# Patient Record
Sex: Female | Born: 1960 | Race: White | Hispanic: No | Marital: Married | Smoking: Never smoker
Health system: Southern US, Community
[De-identification: ages and names within clinical notes are randomized; demographics above are authoritative.]

## PROBLEM LIST (undated history)

## (undated) DIAGNOSIS — F32A Depression, unspecified: Secondary | ICD-10-CM

## (undated) DIAGNOSIS — Z87440 Personal history of urinary (tract) infections: Secondary | ICD-10-CM

## (undated) DIAGNOSIS — F329 Major depressive disorder, single episode, unspecified: Secondary | ICD-10-CM

## (undated) DIAGNOSIS — R519 Headache, unspecified: Secondary | ICD-10-CM

## (undated) DIAGNOSIS — R51 Headache: Secondary | ICD-10-CM

## (undated) DIAGNOSIS — N83209 Unspecified ovarian cyst, unspecified side: Secondary | ICD-10-CM

## (undated) HISTORY — PX: BUNIONECTOMY: SHX129

## (undated) HISTORY — PX: ANTERIOR CRUCIATE LIGAMENT REPAIR: SHX115

## (undated) HISTORY — PX: OTHER SURGICAL HISTORY: SHX169

## (undated) HISTORY — PX: CHOLECYSTECTOMY: SHX55

---

## 1998-06-05 ENCOUNTER — Ambulatory Visit (HOSPITAL_COMMUNITY): Admission: RE | Admit: 1998-06-05 | Discharge: 1998-06-05 | Payer: Self-pay | Admitting: Gynecology

## 1998-06-12 ENCOUNTER — Ambulatory Visit (HOSPITAL_COMMUNITY): Admission: RE | Admit: 1998-06-12 | Discharge: 1998-06-12 | Payer: Self-pay | Admitting: Gynecology

## 1999-03-07 ENCOUNTER — Inpatient Hospital Stay (HOSPITAL_COMMUNITY): Admission: AD | Admit: 1999-03-07 | Discharge: 1999-03-09 | Payer: Self-pay | Admitting: Obstetrics and Gynecology

## 1999-03-10 ENCOUNTER — Encounter: Admission: RE | Admit: 1999-03-10 | Discharge: 1999-06-08 | Payer: Self-pay | Admitting: Obstetrics and Gynecology

## 1999-04-04 ENCOUNTER — Other Ambulatory Visit: Admission: RE | Admit: 1999-04-04 | Discharge: 1999-04-04 | Payer: Self-pay | Admitting: Obstetrics and Gynecology

## 1999-06-10 ENCOUNTER — Encounter: Admission: RE | Admit: 1999-06-10 | Discharge: 1999-09-08 | Payer: Self-pay | Admitting: Obstetrics and Gynecology

## 1999-11-28 ENCOUNTER — Ambulatory Visit (HOSPITAL_BASED_OUTPATIENT_CLINIC_OR_DEPARTMENT_OTHER): Admission: RE | Admit: 1999-11-28 | Discharge: 1999-11-29 | Payer: Self-pay | Admitting: Orthopedic Surgery

## 2000-02-05 ENCOUNTER — Emergency Department (HOSPITAL_COMMUNITY): Admission: EM | Admit: 2000-02-05 | Discharge: 2000-02-05 | Payer: Self-pay | Admitting: Emergency Medicine

## 2000-02-07 ENCOUNTER — Observation Stay (HOSPITAL_COMMUNITY): Admission: EM | Admit: 2000-02-07 | Discharge: 2000-02-08 | Payer: Self-pay | Admitting: Emergency Medicine

## 2000-02-07 ENCOUNTER — Encounter: Payer: Self-pay | Admitting: Family Medicine

## 2000-02-07 ENCOUNTER — Encounter: Admission: RE | Admit: 2000-02-07 | Discharge: 2000-02-07 | Payer: Self-pay | Admitting: Family Medicine

## 2001-12-11 ENCOUNTER — Other Ambulatory Visit: Admission: RE | Admit: 2001-12-11 | Discharge: 2001-12-11 | Payer: Self-pay | Admitting: Obstetrics & Gynecology

## 2003-05-13 ENCOUNTER — Other Ambulatory Visit: Admission: RE | Admit: 2003-05-13 | Discharge: 2003-05-13 | Payer: Self-pay | Admitting: Obstetrics & Gynecology

## 2004-06-02 ENCOUNTER — Other Ambulatory Visit: Admission: RE | Admit: 2004-06-02 | Discharge: 2004-06-02 | Payer: Self-pay | Admitting: Obstetrics & Gynecology

## 2013-05-22 HISTORY — PX: BREAST BIOPSY: SHX20

## 2013-07-09 ENCOUNTER — Other Ambulatory Visit: Payer: Self-pay | Admitting: Obstetrics & Gynecology

## 2013-07-09 DIAGNOSIS — R928 Other abnormal and inconclusive findings on diagnostic imaging of breast: Secondary | ICD-10-CM

## 2013-07-22 ENCOUNTER — Other Ambulatory Visit: Payer: Self-pay | Admitting: Obstetrics & Gynecology

## 2013-07-22 ENCOUNTER — Ambulatory Visit
Admission: RE | Admit: 2013-07-22 | Discharge: 2013-07-22 | Disposition: A | Discharge status: Home / Self Care | Payer: BC Managed Care – PPO | Source: Ambulatory Visit | Attending: Obstetrics & Gynecology | Admitting: Obstetrics & Gynecology

## 2013-07-22 DIAGNOSIS — R928 Other abnormal and inconclusive findings on diagnostic imaging of breast: Secondary | ICD-10-CM

## 2013-08-04 ENCOUNTER — Other Ambulatory Visit: Payer: BC Managed Care – PPO

## 2013-08-05 ENCOUNTER — Ambulatory Visit
Admission: RE | Admit: 2013-08-05 | Discharge: 2013-08-05 | Disposition: A | Discharge status: Home / Self Care | Payer: BC Managed Care – PPO | Source: Ambulatory Visit | Attending: Obstetrics & Gynecology | Admitting: Obstetrics & Gynecology

## 2013-08-05 ENCOUNTER — Other Ambulatory Visit: Payer: Self-pay | Admitting: Obstetrics & Gynecology

## 2013-08-05 DIAGNOSIS — R928 Other abnormal and inconclusive findings on diagnostic imaging of breast: Secondary | ICD-10-CM

## 2014-07-17 ENCOUNTER — Other Ambulatory Visit: Payer: Self-pay

## 2014-07-17 DIAGNOSIS — Z1231 Encounter for screening mammogram for malignant neoplasm of breast: Secondary | ICD-10-CM

## 2014-07-31 ENCOUNTER — Ambulatory Visit
Admission: RE | Admit: 2014-07-31 | Discharge: 2014-07-31 | Disposition: A | Discharge status: Home / Self Care | Payer: BLUE CROSS/BLUE SHIELD | Source: Ambulatory Visit

## 2014-07-31 DIAGNOSIS — Z1231 Encounter for screening mammogram for malignant neoplasm of breast: Secondary | ICD-10-CM

## 2014-08-03 ENCOUNTER — Other Ambulatory Visit: Payer: Self-pay | Admitting: Obstetrics & Gynecology

## 2014-08-03 DIAGNOSIS — R928 Other abnormal and inconclusive findings on diagnostic imaging of breast: Secondary | ICD-10-CM

## 2014-08-04 ENCOUNTER — Other Ambulatory Visit: Payer: Self-pay | Admitting: Obstetrics & Gynecology

## 2014-08-04 DIAGNOSIS — R1900 Intra-abdominal and pelvic swelling, mass and lump, unspecified site: Secondary | ICD-10-CM

## 2014-08-07 ENCOUNTER — Ambulatory Visit
Admission: RE | Admit: 2014-08-07 | Discharge: 2014-08-07 | Disposition: A | Discharge status: Home / Self Care | Payer: BLUE CROSS/BLUE SHIELD | Source: Ambulatory Visit | Attending: Obstetrics & Gynecology | Admitting: Obstetrics & Gynecology

## 2014-08-07 DIAGNOSIS — R1900 Intra-abdominal and pelvic swelling, mass and lump, unspecified site: Secondary | ICD-10-CM

## 2014-08-07 DIAGNOSIS — R928 Other abnormal and inconclusive findings on diagnostic imaging of breast: Secondary | ICD-10-CM

## 2014-08-07 MED ORDER — IOPAMIDOL (ISOVUE-300) INJECTION 61%
100.0000 mL | Freq: Once | INTRAVENOUS | Status: AC | PRN
  Administered 2014-08-07: 100 mL via INTRAVENOUS

## 2014-09-14 ENCOUNTER — Ambulatory Visit: Payer: BLUE CROSS/BLUE SHIELD | Admitting: Gynecologic Oncology

## 2014-09-18 ENCOUNTER — Encounter: Payer: Self-pay | Admitting: Gynecologic Oncology

## 2014-09-18 ENCOUNTER — Ambulatory Visit: Payer: BLUE CROSS/BLUE SHIELD | Attending: Gynecologic Oncology | Admitting: Gynecologic Oncology

## 2014-09-18 VITALS — BP 139/73 | HR 94 | Temp 98.2°F | Resp 18 | Ht 62.0 in | Wt 172.3 lb

## 2014-09-18 DIAGNOSIS — R971 Elevated cancer antigen 125 [CA 125]: Secondary | ICD-10-CM | POA: Diagnosis not present

## 2014-09-18 DIAGNOSIS — N8320 Unspecified ovarian cysts: Secondary | ICD-10-CM | POA: Insufficient documentation

## 2014-09-18 DIAGNOSIS — N83201 Unspecified ovarian cyst, right side: Secondary | ICD-10-CM

## 2014-09-18 DIAGNOSIS — E669 Obesity, unspecified: Secondary | ICD-10-CM | POA: Insufficient documentation

## 2014-09-18 DIAGNOSIS — Z803 Family history of malignant neoplasm of breast: Secondary | ICD-10-CM

## 2014-09-18 DIAGNOSIS — Z800 Family history of malignant neoplasm of digestive organs: Secondary | ICD-10-CM | POA: Diagnosis not present

## 2014-09-18 DIAGNOSIS — N83202 Unspecified ovarian cyst, left side: Secondary | ICD-10-CM

## 2014-09-18 NOTE — Patient Instructions (Signed)
Preparing for your Surgery  Plan for surgery on May 3 with Dr. Denman George to have a robotic assisted bilateral salpingo-oophorectomy (removal of both tubes and ovaries).  Pre-operative Testing -You will receive a phone call from presurgical testing at [facility] to arrange for a pre-operative testing appointment before your surgery.  This appointment normally occurs one to two weeks before your scheduled surgery.   -Bring your insurance card, copy of an advanced directive if applicable, medication list  -At that visit, you will be asked to sign a consent for a possible blood transfusion in case a transfusion becomes necessary during surgery.  The need for a blood transfusion is rare but having consent is a necessary part of your care.     -You should not be taking blood thinners or aspirin at least ten days prior to surgery unless instructed by your surgeon.  Day Before Surgery at [place] will be asked to take in only clear liquids the day before surgery.  Examples of clear liquids include broths, jello, and clear juices.  You will be advised to have nothing to eat or drink after midnight the evening before.    Your role in recovery Your role is to become active as soon as directed by your doctor, while still giving yourself time to heal.  Rest when you feel tired. You will be asked to do the following in order to speed your recovery:  - Cough and breathe deeply. This helps toclear and expand your lungs and can prevent pneumonia. You may be given a spirometer to practice deep breathing. A staff member will show you how to use the spirometer. - Do mild physical activity. Walking or moving your legs help your circulation and body functions return to normal. A staff member will help you when you try to walk and will provide you with simple exercises. Do not try to get up or walk alone the first time. - Actively manage your pain. Managing your pain lets you move in comfort. We  will ask you to rate your pain on a scale of zero to 10. It is your responsibility to tell your doctor or nurse where and how much you hurt so your pain can be treated.  Special Considerations -If you are diabetic, you may be placed on insulin after surgery to have closer control over your blood sugars to promote healing and recovery.  This does not mean that you will be discharged on insulin.  If applicable, your oral antidiabetics will be resumed when you are tolerating a solid diet.  -Your final pathology results from surgery should be available by the Friday after surgery and the results will be relayed to you when available.  Blood Transfusion Information WHAT IS A BLOOD TRANSFUSION? A transfusion is the replacement of blood or some of its parts. Blood is made up of multiple cells which provide different functions.  Red blood cells carry oxygen and are used for blood loss replacement.  White blood cells fight against infection.  Platelets control bleeding.  Plasma helps clot blood.  Other blood products are available for specialized needs, such as hemophilia or other clotting disorders. BEFORE THE TRANSFUSION  Who gives blood for transfusions?   You may be able to donate blood to be used at a later date on yourself (autologous donation).  Relatives can be asked to donate blood. This is generally not any safer than if you have received blood from a stranger. The same precautions are taken to ensure  safety when a relative's blood is donated.  Healthy volunteers who are fully evaluated to make sure their blood is safe. This is blood bank blood. Transfusion therapy is the safest it has ever been in the practice of medicine. Before blood is taken from a donor, a complete history is taken to make sure that person has no history of diseases nor engages in risky social behavior (examples are intravenous drug use or sexual activity with multiple partners). The donor's travel history is  screened to minimize risk of transmitting infections, such as malaria. The donated blood is tested for signs of infectious diseases, such as HIV and hepatitis. The blood is then tested to be sure it is compatible with you in order to minimize the chance of a transfusion reaction. If you or a relative donates blood, this is often done in anticipation of surgery and is not appropriate for emergency situations. It takes many days to process the donated blood. RISKS AND COMPLICATIONS Although transfusion therapy is very safe and saves many lives, the main dangers of transfusion include:   Getting an infectious disease.  Developing a transfusion reaction. This is an allergic reaction to something in the blood you were given. Every precaution is taken to prevent this. The decision to have a blood transfusion has been considered carefully by your caregiver before blood is given. Blood is not given unless the benefits outweigh the risks.  Bilateral Salpingo-Oophorectomy Bilateral salpingo-oophorectomy is the surgical removal of both fallopian tubes and both ovaries. The ovaries are small organs that produce eggs in women. The fallopian tubes transport the egg from the ovary to the womb (uterus). Usually, when this surgery is done, the uterus was previously removed. A bilateral salpingo-oophorectomy may be done to treat cancer or to reduce the risk of cancer in women who are at high risk. Removing both fallopian tubes and both ovaries will make you unable to become pregnant (sterile). It will also put you into menopause so that you will no longer have menstrual periods and may have menopausal symptoms such as hot flashes, night sweats, and mood changes. It will not affect your sex drive. LET [facility] CARE PROVIDER KNOW ABOUT:  Any allergies you have.  All medicines you are taking, including vitamins, herbs, eye drops, creams, and over-the-counter medicines.  Previous problems you or members of your  family have had with the use of anesthetics.  Any blood disorders you have.  Previous surgeries you have had.  Medical conditions you have. RISKS AND COMPLICATIONS Generally, this is a safe procedure. However, as with any procedure, complications can occur. Possible complications include:  Injury to surrounding organs.  Bleeding.  Infection.  Blood clots in the legs or lungs.  Problems related to anesthesia. BEFORE THE PROCEDURE  Ask your health care provider about changing or stopping your regular medicines. You may need to stop taking certain medicines, such as aspirin or blood thinners, at least 1 week before the surgery.  Do not eat or drink anything for at least 8 hours before the surgery.  If you smoke, do not smoke for at least 2 weeks before the surgery.  Make plans to have someone drive you home after the procedure or after your hospital stay. Also arrange for someone to help you with activities during recovery. PROCEDURE   You will be given medicine to help you relax before the procedure (sedative). You will then be given medicine to make you sleep through the procedure (general anesthetic). These medicines will be  given through an IV access tube that is put into one of your veins.  Once you are asleep, your lower abdomen will be shaved and cleaned. A thin, flexible tube (catheter) will be placed in your bladder.  The surgeon may use a laparoscopic, robotic, or open technique for this surgery:  In the laparoscopic technique, the surgery is done through two small cuts (incisions) in the abdomen. A thin, lighted tube with a tiny camera on the end (laparoscope) is inserted into one of the incisions. The tools needed for the procedure are put through the other incision.  A robotic technique may be chosen to perform complex surgery in a small space. In the robotic technique, small incisions will be made. A camera and surgical instruments are passed through the incisions.  Surgical instruments will be controlled with the help of a robotic arm.  In the open technique, the surgery is done through one large incision in the abdomen.  Using any of these techniques, the surgeon removes the fallopian tubes and ovaries. The blood vessels will be clamped and tied.  The surgeon then uses staples or stitches to close the incision or incisions. AFTER THE PROCEDURE  You will be taken to a recovery area where you will be monitored for 1 to 3 hours. Your blood pressure, pulse, and temperature will be checked often. You will remain in the recovery area until you are stable and waking up.  If the laparoscopic technique was used, you may be allowed to go home after several hours. You may have some shoulder pain after the laparoscopic procedure. This is normal and usually goes away in a day or two.  If the open technique was used, you will be admitted to the hospital for a couple of days.  You will be given pain medicine as needed.  The IV access tube and catheter will be removed before you are discharged. Document Released: 05/08/2005 Document Revised: 05/13/2013 Document Reviewed: 10/30/2012 [facility] Patient Information 2015 [place], [place]. This information is not intended to replace advice given to you by your health care provider. Make sure you discuss any questions you have with your health care provider.   Bilateral Salpingo-Oophorectomy, Care After Refer to this sheet in the next few weeks. These instructions provide you with information on caring for yourself after your procedure. Your health care provider may also give you more specific instructions. Your treatment has been planned according to current medical practices, but problems sometimes occur. Call your health care provider if you have any problems or questions after your procedure. WHAT TO EXPECT AFTER THE PROCEDURE After your procedure, it is typical to have the following:   Abdominal pain that can be  controlled with medicine.  Vaginal spotting.  Constipation.  Menopausal symptoms such as hot flashes, vaginal dryness, and mood swings. HOME CARE INSTRUCTIONS   Get plenty of rest and sleep.  Only take over-the-counter or prescription medicines as directed by your health care provider. Do not take aspirin. It can cause bleeding.  Keep incision areas clean and dry. Remove or change bandages (dressings) only as directed by your health care provider.  Take showers instead of baths for a few weeks as directed by your health care provider.  Limit exercise and activities as directed by your health care provider. Do not lift anything heavier than 5 pounds (2.3 kg) until your health care provider approves.  Do not drive until your health care provider approves.  Follow your health care provider's advice regarding diet. You  may be able to resume your usual diet right away.  Drink enough fluids to keep your urine clear or pale yellow.  Do not douche, use tampons, or have sexual intercourse for 6 weeks after the procedure.  Do not drink alcohol until your health care provider says it is okay.  Take your temperature twice a day and write it down.  If you become constipated, you may:  Ask your health care provider about taking a mild laxative.  Add more fruit and bran to your diet.  Drink more fluids.  Follow up with your health care provider as directed. SEEK MEDICAL CARE IF:   You have swelling, redness, or increasing pain in the incision area.  You see pus coming from the incision area.  You notice a bad smell coming from the wound or dressing.  You have pain, redness, or swelling where the IV access tube was placed.  Your incision is breaking open (the edges are not staying together).  You feel dizzy or feel like fainting.  You develop pain or bleeding when you urinate.  You develop diarrhea.  You develop nausea and vomiting.  You develop abnormal vaginal  discharge.  You develop a rash.  You have pain that is not controlled with medicine. SEEK IMMEDIATE MEDICAL CARE IF:   You develop a fever.  You develop abdominal pain.  You have chest pain.  You develop shortness of breath.  You pass out.  You develop pain, swelling, or redness in your leg.  You develop heavy vaginal bleeding with or without blood clots. Document Released: 05/08/2005 Document Revised: 01/08/2013 Document Reviewed: 10/30/2012 [facility] Patient Information 2015 [place], [place]. This information is not intended to replace advice given to you by your health care provider. Make sure you discuss any questions you have with your health care provider.

## 2014-09-18 NOTE — Progress Notes (Signed)
Consult Note: Gyn-Onc  Consult was requested by Dr. Stann Mainland or the evaluation of Sheila Galloway 54 y.o. female with bilateral ovarian cysts  CC:  Chief Complaint  Patient presents with  . Right Ovarian Cyst    has moved into upper abdomen    Assessment/Plan:  Ms. Sheila Galloway  is a 54 y.o.  year old with bilateral ovarian cysts and a mildly elevated CA 125.  I performed a history, physical examination, and personally reviewed the patient's imaging films including the CT scan from 08/07/14. I agree with the suspicion that these are most likely benign ovarian cysts given their uniform regularity and relatively simple appearance on imaging. However given the large size of particular the right ovarian cyst, and the mild elevation in CA-125 I am recommending surgical removal and frozen section pathology with staging if indicated. I did extensive discussion with Anahita and her husband regarding surgical options. I expressed that it felt that the right ovary needed to be removed in entirety. The left ovary could potentially be spared and she could have a left ovarian cystectomy if she preferred. This would facilitate her preserving hormonal status until a more natural age of menopause, which is been associated with overall prolonged life expectancy compared to surgical menopause prior to age 75. However, given her strong family history for breast cancer, has some concern that she may harbor a predisposition for breast and ovarian cancer which is leading me to my final recommendation of BSO. The patient is in agreement with proceeding with robotic assisted laparoscopic BSO, possible staging including possible hysterectomy malignancy is found.  I discussed operative risks with the patient including  bleeding, infection, damage to internal organs (such as bladder,ureters, bowels), blood clot, reoperation and rehospitalization. We discussed use of postop HRT for <5years if surgical menopause symptoms are  severe.  HPI: Sheila Galloway is a 54 year old gravida 3 para 3 who is seen in consultation at the request of Dr. Stann Mainland for bilateral ovarian cysts and elevated CA-125. The patient was seen by Dr. Stann Mainland in March for routine annual examination. On pelvic examination a cystic mass was appreciated. This prompted a CT scan of the abdomen and pelvis on 08/07/2014. This revealed bilateral regular unilocular ovarian cysts, the large on the right measuring 9 cm in greatest dimension and on the left measuring 4 x 3 cm. Were no signs on CT concerning for metastatic ovarian cancer including no lymphadenopathy, no ascites, no peritoneal carcinomatosis.  A CA-125 was drawn on 09/08/2014 and was slightly elevated at 59 units per milliliter.  The patient has no symptoms concerning for metastatic ovarian cancer including no bloating, no early satiety, no pelvic pain or abdominal pain, and no change in bladder or bowel habit. She is a healthy woman with prior surgeries including a cholecystectomy and cesarean section. She has a strong maternal family history for breast cancer, all postmenopausal age, in her mother, maternal aunt, and grandmother. Her mother also has a history of colon cancer.     Current Meds:  Outpatient Encounter Prescriptions as of 09/18/2014  Medication Sig  . acetaminophen (TYLENOL) 325 MG tablet Take 650 mg by mouth every 6 (six) hours as needed.  . diclofenac (VOLTAREN) 75 MG EC tablet   . nitrofurantoin, macrocrystal-monohydrate, (MACROBID) 100 MG capsule Take 100 mg by mouth as needed.  . pseudoephedrine (SUDAFED) 30 MG tablet Take 30 mg by mouth every 4 (four) hours as needed for congestion.    Allergy:  Allergies  Allergen Reactions  .  Zoloft [Sertraline Hcl] Hives    Social Hx:   History   Social History  . Marital Status: Married    Spouse Name: N/A  . Number of Children: N/A  . Years of Education: N/A   Occupational History  . Not on file.   Social History Main Topics   . Smoking status: Never Smoker   . Smokeless tobacco: Not on file  . Alcohol Use: Not on file  . Drug Use: No  . Sexual Activity: Yes   Other Topics Concern  . Not on file   Social History Narrative  . No narrative on file    Past Surgical Hx: History reviewed. No pertinent past surgical history.  Past Medical Hx: History reviewed. No pertinent past medical history.  Past Gynecological History:  No LMP recorded. CS x 1, SVD x 2, IUD in place (paraguard).  Family Hx:  Family History  Problem Relation Age of Onset  . Cancer Mother     breast and colon  . Cancer Maternal Aunt     postmenopausal breast  . Cancer Maternal Grandmother     postmenopausal breast    Review of Systems:  Constitutional  Feels well,    ENT Normal appearing ears and nares bilaterally Skin/Breast  No rash, sores, jaundice, itching, dryness Cardiovascular  No chest pain, shortness of breath, or edema  Pulmonary  No cough or wheeze.  Gastro Intestinal  No nausea, vomitting, or diarrhoea. No bright red blood per rectum, no abdominal pain, change in bowel movement, or constipation.  Genito Urinary  No frequency, urgency, dysuria, see HPI Musculo Skeletal  No myalgia, arthralgia, joint swelling or pain  Neurologic  No weakness, numbness, change in gait,  Psychology  No depression, anxiety, insomnia.   Vitals:  Blood pressure 139/73, pulse 94, temperature 98.2 F (36.8 C), temperature source Oral, resp. rate 18, height 5\' 2"  (1.575 m), weight 172 lb 4.8 oz (78.155 kg).  Physical Exam: WD in NAD Neck  Supple NROM, without any enlargements.  Lymph Node Survey No cervical supraclavicular or inguinal adenopathy Cardiovascular  Pulse normal rate, regularity and rhythm. S1 and S2 normal.  Lungs  Clear to auscultation bilateraly, without wheezes/crackles/rhonchi. Good air movement.  Skin  No rash/lesions/breakdown  Psychiatry  Alert and oriented to person, place, and time  Abdomen   Normoactive bowel sounds, abdomen soft, non-tender and obese without evidence of hernia.  Back No CVA tenderness Genito Urinary  Vulva/vagina: Normal external female genitalia.   No lesions. No discharge or bleeding.  Bladder/urethra:  No lesions or masses, well supported bladder  Vagina: normal, some prolapse  Cervix: Normal appearing, no lesions.  Uterus:  Small, mobile, no parametrial involvement or nodularity.  Adnexa: smooth regular mass appreciated in right pelvic abdominal region, mobile.  Rectal  Good tone, no masses no cul de sac nodularity.  Extremities  No bilateral cyanosis, clubbing or edema.   [name], MD   09/18/2014, 3:29 PM

## 2014-09-18 NOTE — Patient Instructions (Addendum)
Your procedure is scheduled on:  09/22/14  TUESDAY  Report to [facility] at     1:30 PM   Call this number if you have problems the morning of surgery: [place] DIET TODAY--Monday-- MAY HAVE CLEAR LIQUIDS Tuesday MORNING UNTIL 09:30AM-  THEN NOTHING BY MOUTH                Take these medicines the morning of surgery: [place] eye drops if needed   .  Contacts, dentures or partial plates, or metal hairpins  can not be worn to surgery. Your family will be responsible for glasses, dentures, hearing aides while you are in surgery  Leave suitcase in the car. After surgery it may be brought to your room.  For patients admitted to the hospital, checkout time is 11:00 AM day of  discharge.         [facility] IS NOT RESPONSIBLE FOR ANY VALUABLES  Patients discharged the day of surgery will not be allowed to drive home. IF going home the day of surgery, you must have a driver and someone to stay with you for the first 24 hours  Name and phone number of your driver:John Erkkila (husband)                                                                                                                                         [facility] - Preparing for Surgery Before surgery, you can play an important role.  Because skin is not sterile, your skin needs to be as free of germs as possible.  You can reduce the number of germs on your skin by washing with CHG (chlorahexidine gluconate) soap before surgery.  CHG is an antiseptic cleaner which kills germs and bonds with the skin to continue killing germs even after washing. Please DO NOT use if you have an allergy to CHG or antibacterial soaps.  If your skin becomes reddened/irritated stop using the CHG and inform your nurse when you arrive at Short Stay. Do not shave (including legs and underarms) for at least 48 hours prior to the first CHG  shower.  You may shave your face/neck. Please follow these instructions carefully:  1.  Shower with CHG Soap the night before surgery and the  morning of Surgery.  2.  If you choose to wash your hair, wash your hair first as usual with your  normal  shampoo.  3.  After you shampoo, rinse your hair and body thoroughly to remove the  shampoo.                           4.  Use CHG as you would any other liquid soap.  You can apply chg directly  to the skin and wash                       Gently with a scrungie or clean washcloth.  5.  Apply the CHG Soap to your body ONLY FROM THE NECK DOWN.   Do not use on face/ open                           Wound or open sores. Avoid contact with eyes, ears mouth and genitals (private parts).                       Wash face,  Genitals (private parts) with your normal soap.             6.  Wash thoroughly, paying special attention to the area where your surgery  will be performed.  7.  Thoroughly rinse your body with warm water from the neck down.  8.  DO NOT shower/wash with your normal soap after using and rinsing off  the CHG Soap.                9.  Pat yourself dry with a clean towel.            10.  Wear clean pajamas.            11.  Place clean sheets on your bed the night of your first shower and do not  sleep with pets. Day of Surgery : Do not apply any lotions/deodorants the morning of surgery.  Please wear clean clothes to the hospital/surgery center.  FAILURE TO FOLLOW THESE INSTRUCTIONS MAY RESULT IN THE CANCELLATION OF YOUR SURGERY PATIENT SIGNATURE_________________________________  NURSE SIGNATURE__________________________________  ________________________________________________________________________  WHAT IS A BLOOD TRANSFUSION? Blood Transfusion Information  A transfusion is the replacement of blood or some of its parts. Blood is made up of multiple cells which provide different functions.  Red blood cells carry oxygen and are used for  blood loss replacement.  White blood cells fight against infection.  Platelets control bleeding.  Plasma helps clot blood.  Other blood products are available for specialized needs, such as hemophilia or other clotting disorders. BEFORE THE TRANSFUSION  Who gives blood for transfusions?   Healthy volunteers who are fully evaluated to make sure their blood is safe. This is blood bank blood. Transfusion therapy is the safest it has ever been in the practice of medicine. Before blood is taken from a donor, a complete history is taken to make sure that person has no history of diseases nor engages in risky social behavior (examples are intravenous drug use or sexual activity with multiple partners). The donor's travel history is screened to minimize risk of transmitting infections, such as malaria. The donated blood is tested for signs of infectious diseases, such as HIV and hepatitis. The blood is then tested to be sure it is compatible with you in order to minimize the chance of a transfusion reaction. If you or a relative donates blood, this is often done in anticipation of surgery and is not appropriate for emergency situations. It takes many days to process the donated blood. RISKS AND COMPLICATIONS Although transfusion therapy is very safe and saves many lives, the main dangers of transfusion include:  1. Getting an infectious disease. 2. Developing a transfusion reaction. This is an allergic reaction to something in the blood you were given. Every precaution is taken to prevent  this. The decision to have a blood transfusion has been considered carefully by your caregiver before blood is given. Blood is not given unless the benefits outweigh the risks. AFTER THE TRANSFUSION  Right after receiving a blood transfusion, you will usually feel much better and more energetic. This is especially true if your red blood cells have gotten low (anemic). The transfusion raises the level of the red blood  cells which carry oxygen, and this usually causes an energy increase.  The nurse administering the transfusion will monitor you carefully for complications. HOME CARE INSTRUCTIONS  No special instructions are needed after a transfusion. You may find your energy is better. Speak with your caregiver about any limitations on activity for underlying diseases you may have. SEEK MEDICAL CARE IF:   Your condition is not improving after your transfusion.  You develop redness or irritation at the intravenous (IV) site. SEEK IMMEDIATE MEDICAL CARE IF:  Any of the following symptoms occur over the next 12 hours:  Shaking chills.  You have a temperature by mouth above 102 F (38.9 C), not controlled by medicine.  Chest, back, or muscle pain.  People around you feel you are not acting correctly or are confused.  Shortness of breath or difficulty breathing.  Dizziness and fainting.  You get a rash or develop hives.  You have a decrease in urine output.  Your urine turns a dark color or changes to pink, red, or brown. Any of the following symptoms occur over the next 10 days:  You have a temperature by mouth above 102 F (38.9 C), not controlled by medicine.  Shortness of breath.  Weakness after normal activity.  The white part of the eye turns yellow (jaundice).  You have a decrease in the amount of urine or are urinating less often.  Your urine turns a dark color or changes to pink, red, or brown. Document Released: 05/05/2000 Document Revised: 07/31/2011 Document Reviewed: 12/23/2007 Exit[employer] Patient Information 2014 [employer], [place].  _______________________________________________________________________   CLEAR LIQUID DIET   Foods Allowed                                                                     Foods Excluded  Coffee and tea, regular and decaf                             liquids that you cannot  Plain Jell-O in any flavor                                              see through such as: Fruit ices (not with fruit pulp)                                     milk, soups, orange juice  Iced Popsicles                                    All solid  food Carbonated beverages, regular and diet                                    Cranberry, grape and apple juices Sports drinks like Gatorade Lightly seasoned clear broth or consume(fat free) Sugar, honey syrup  Sample Menu Breakfast                                Lunch                                     Supper Cranberry juice                    Beef broth                            Chicken broth Jell-O                                     Grape juice                           Apple juice Coffee or tea                        Jell-O                                      Popsicle                                                Coffee or tea                        Coffee or tea  _____________________________________________________________________    Incentive Spirometer  An incentive spirometer is a tool that can help keep your lungs clear and active. This tool measures how well you are filling your lungs with each breath. Taking long deep breaths may help reverse or decrease the chance of developing breathing (pulmonary) problems (especially infection) following:  A long period of time when you are unable to move or be active. BEFORE THE PROCEDURE   If the spirometer includes an indicator to show your best effort, your nurse or respiratory therapist will set it to a desired goal.  If possible, sit up straight or lean slightly forward. Try not to slouch.  Hold the incentive spirometer in an upright position. INSTRUCTIONS FOR USE  3. Sit on the edge of your bed if possible, or sit up as far as you can in bed or on a chair. 4. Hold the incentive spirometer in an upright position. 5. Breathe out normally. 6. Place the mouthpiece in your mouth and seal your lips tightly around it. 7. Breathe in slowly and as  deeply as possible, raising the piston or the ball toward the top of the column. 8. Hold your breath for 3-5 seconds or for  as long as possible. Allow the piston or ball to fall to the bottom of the column. 9. Remove the mouthpiece from your mouth and breathe out normally. 10. Rest for a few seconds and repeat Steps 1 through 7 at least 10 times every 1-2 hours when you are awake. Take your time and take a few normal breaths between deep breaths. 11. The spirometer may include an indicator to show your best effort. Use the indicator as a goal to work toward during each repetition. 12. After each set of 10 deep breaths, practice coughing to be sure your lungs are clear. If you have an incision (the cut made at the time of surgery), support your incision when coughing by placing a pillow or rolled up towels firmly against it. Once you are able to get out of bed, walk around indoors and cough well. You may stop using the incentive spirometer when instructed by your caregiver.  RISKS AND COMPLICATIONS  Take your time so you do not get dizzy or light-headed.  If you are in pain, you may need to take or ask for pain medication before doing incentive spirometry. It is harder to take a deep breath if you are having pain. AFTER USE  Rest and breathe slowly and easily.  It can be helpful to keep track of a log of your progress. Your caregiver can provide you with a simple table to help with this. If you are using the spirometer at home, follow these instructions: [place] IF:   You are having difficultly using the spirometer.  You have trouble using the spirometer as often as instructed.  Your pain medication is not giving enough relief while using the spirometer.  You develop fever of 100.5 F (38.1 C) or higher. SEEK IMMEDIATE MEDICAL CARE IF:   You cough up bloody sputum that had not been present before.  You develop fever of 102 F (38.9 C) or greater.  You develop worsening  pain at or near the incision site. MAKE SURE YOU:   Understand these instructions.  Will watch your condition.  Will get help right away if you are not doing well or get worse. Document Released: 09/18/2006 Document Revised: 07/31/2011 Document Reviewed: 11/19/2006 [facility] Patient Information 2014 [place], [place].   ________________________________________________________________________

## 2014-09-21 ENCOUNTER — Ambulatory Visit (HOSPITAL_COMMUNITY)
Admission: RE | Admit: 2014-09-21 | Discharge: 2014-09-21 | Disposition: A | Discharge status: Home / Self Care | Payer: BLUE CROSS/BLUE SHIELD | Source: Ambulatory Visit | Attending: Anesthesiology | Admitting: Anesthesiology

## 2014-09-21 ENCOUNTER — Encounter (HOSPITAL_COMMUNITY): Payer: Self-pay

## 2014-09-21 ENCOUNTER — Encounter (HOSPITAL_COMMUNITY)
Admission: RE | Admit: 2014-09-21 | Discharge: 2014-09-21 | Disposition: A | Discharge status: Home / Self Care | Payer: BLUE CROSS/BLUE SHIELD | Source: Ambulatory Visit | Attending: Gynecologic Oncology | Admitting: Gynecologic Oncology

## 2014-09-21 DIAGNOSIS — R0981 Nasal congestion: Secondary | ICD-10-CM | POA: Insufficient documentation

## 2014-09-21 HISTORY — DX: Personal history of urinary (tract) infections: Z87.440

## 2014-09-21 HISTORY — DX: Major depressive disorder, single episode, unspecified: F32.9

## 2014-09-21 HISTORY — DX: Headache, unspecified: R51.9

## 2014-09-21 HISTORY — DX: Depression, unspecified: F32.A

## 2014-09-21 HISTORY — DX: Unspecified ovarian cyst, unspecified side: N83.209

## 2014-09-21 HISTORY — DX: Headache: R51

## 2014-09-21 LAB — CBC
HCT: 39.9 % (ref 36.0–46.0)
Hemoglobin: 13.7 g/dL (ref 12.0–15.0)
MCH: 30.2 pg (ref 26.0–34.0)
MCHC: 34.3 g/dL (ref 30.0–36.0)
MCV: 88.1 fL (ref 78.0–100.0)
Platelets: 214 10*3/uL (ref 150–400)
RBC: 4.53 MIL/uL (ref 3.87–5.11)
RDW: 13.7 % (ref 11.5–15.5)
WBC: 7.3 10*3/uL (ref 4.0–10.5)

## 2014-09-21 LAB — COMPREHENSIVE METABOLIC PANEL
ALT: 45 U/L (ref 14–54)
AST: 32 U/L (ref 15–41)
Albumin: 4.4 g/dL (ref 3.5–5.0)
Alkaline Phosphatase: 74 U/L (ref 38–126)
Anion gap: 7 (ref 5–15)
BUN: 9 mg/dL (ref 6–20)
CO2: 24 mmol/L (ref 22–32)
Calcium: 8.9 mg/dL (ref 8.9–10.3)
Chloride: 104 mmol/L (ref 101–111)
Creatinine, Ser: 0.53 mg/dL (ref 0.44–1.00)
GFR calc Af Amer: >60 mL/min (ref >60)
GFR calc non Af Amer: >60 mL/min (ref >60)
Glucose, Bld: 95 mg/dL (ref 70–99)
Potassium: 4 mmol/L (ref 3.5–5.1)
Sodium: 135 mmol/L (ref 135–145)
Total Bilirubin: 0.9 mg/dL (ref 0.3–1.2)
Total Protein: 6.9 g/dL (ref 6.5–8.1)

## 2014-09-21 LAB — ABO/RH: ABO/RH(D): O POS

## 2014-09-21 LAB — HCG, SERUM, QUALITATIVE: Preg, Serum: NEGATIVE

## 2014-09-21 NOTE — Progress Notes (Addendum)
Pt experiencing nasal congestion pt states just has a small cold. This nurse discussed with pt to inform PCP of situation and if placed on medication will need to notify Dr Serita Grit office. Pt verbalized understanding. This nurse did notify Melissa with OB / GYN oncology;Dr Denman George aware of results and no new orders given. Dr Denman George will see pt morning of surgery.

## 2014-09-21 NOTE — Progress Notes (Signed)
CXR in epic per PAT visit 09/21/2014 sent to Dr Denman George

## 2014-09-22 ENCOUNTER — Ambulatory Visit (HOSPITAL_COMMUNITY)
Admission: RE | Admit: 2014-09-22 | Discharge: 2014-09-22 | Disposition: A | Discharge status: Home / Self Care | Payer: BLUE CROSS/BLUE SHIELD | Source: Ambulatory Visit | Attending: Gynecologic Oncology | Admitting: Gynecologic Oncology

## 2014-09-22 ENCOUNTER — Encounter (HOSPITAL_COMMUNITY): Payer: Self-pay | Admitting: Gynecologic Oncology

## 2014-09-22 ENCOUNTER — Ambulatory Visit (HOSPITAL_COMMUNITY): Payer: BLUE CROSS/BLUE SHIELD | Admitting: Certified Registered"

## 2014-09-22 ENCOUNTER — Encounter (HOSPITAL_COMMUNITY)
Admission: RE | Disposition: A | Discharge status: Home / Self Care | Payer: Self-pay | Source: Ambulatory Visit | Attending: Gynecologic Oncology

## 2014-09-22 DIAGNOSIS — N809 Endometriosis, unspecified: Secondary | ICD-10-CM | POA: Diagnosis not present

## 2014-09-22 DIAGNOSIS — R971 Elevated cancer antigen 125 [CA 125]: Secondary | ICD-10-CM | POA: Diagnosis not present

## 2014-09-22 DIAGNOSIS — D270 Benign neoplasm of right ovary: Secondary | ICD-10-CM | POA: Insufficient documentation

## 2014-09-22 DIAGNOSIS — Z803 Family history of malignant neoplasm of breast: Secondary | ICD-10-CM | POA: Insufficient documentation

## 2014-09-22 DIAGNOSIS — D279 Benign neoplasm of unspecified ovary: Secondary | ICD-10-CM

## 2014-09-22 DIAGNOSIS — D282 Benign neoplasm of uterine tubes and ligaments: Secondary | ICD-10-CM | POA: Insufficient documentation

## 2014-09-22 DIAGNOSIS — Z79899 Other long term (current) drug therapy: Secondary | ICD-10-CM | POA: Diagnosis not present

## 2014-09-22 DIAGNOSIS — N831 Corpus luteum cyst: Secondary | ICD-10-CM | POA: Insufficient documentation

## 2014-09-22 DIAGNOSIS — N83201 Unspecified ovarian cyst, right side: Secondary | ICD-10-CM | POA: Diagnosis present

## 2014-09-22 DIAGNOSIS — N83202 Unspecified ovarian cyst, left side: Secondary | ICD-10-CM

## 2014-09-22 DIAGNOSIS — N8320 Unspecified ovarian cysts: Secondary | ICD-10-CM | POA: Diagnosis present

## 2014-09-22 HISTORY — PX: ROBOTIC ASSISTED SALPINGO OOPHERECTOMY: SHX6082

## 2014-09-22 LAB — TYPE AND SCREEN
ABO/RH(D): O POS
Antibody Screen: NEGATIVE

## 2014-09-22 SURGERY — ROBOTIC ASSISTED SALPINGO OOPHORECTOMY
Anesthesia: General | Laterality: Bilateral

## 2014-09-22 MED ORDER — SODIUM CHLORIDE 0.9 % IJ SOLN
INTRAMUSCULAR | Status: AC
  Filled 2014-09-22: qty 10

## 2014-09-22 MED ORDER — KETOROLAC TROMETHAMINE 15 MG/ML IJ SOLN
15.0000 mg | Freq: Four times a day (QID) | INTRAMUSCULAR | Status: DC
  Administered 2014-09-22: 15 mg via INTRAVENOUS

## 2014-09-22 MED ORDER — SODIUM CHLORIDE 0.9 % IJ SOLN: 3.0000 mL | INTRAMUSCULAR | Status: DC | PRN

## 2014-09-22 MED ORDER — DEXAMETHASONE SODIUM PHOSPHATE 10 MG/ML IJ SOLN
INTRAMUSCULAR | Status: AC
  Filled 2014-09-22: qty 1

## 2014-09-22 MED ORDER — LIDOCAINE HCL (CARDIAC) 20 MG/ML IV SOLN
INTRAVENOUS | Status: AC
  Filled 2014-09-22: qty 5

## 2014-09-22 MED ORDER — GLYCOPYRROLATE 0.2 MG/ML IJ SOLN
INTRAMUSCULAR | Status: DC | PRN
  Administered 2014-09-22: .8 mg via INTRAVENOUS

## 2014-09-22 MED ORDER — ONDANSETRON HCL 4 MG/2ML IJ SOLN
INTRAMUSCULAR | Status: DC | PRN
  Administered 2014-09-22: 4 mg via INTRAVENOUS

## 2014-09-22 MED ORDER — PROMETHAZINE HCL 25 MG/ML IJ SOLN
6.2500 mg | INTRAMUSCULAR | Status: DC | PRN
  Administered 2014-09-22: 6.25 mg via INTRAVENOUS

## 2014-09-22 MED ORDER — MEPERIDINE HCL 50 MG/ML IJ SOLN: 6.2500 mg | INTRAMUSCULAR | Status: DC | PRN

## 2014-09-22 MED ORDER — FENTANYL CITRATE (PF) 100 MCG/2ML IJ SOLN: 25.0000 ug | INTRAMUSCULAR | Status: DC | PRN

## 2014-09-22 MED ORDER — SUFENTANIL CITRATE 50 MCG/ML IV SOLN
INTRAVENOUS | Status: AC
  Filled 2014-09-22: qty 1

## 2014-09-22 MED ORDER — LACTATED RINGERS IV SOLN: INTRAVENOUS | Status: DC

## 2014-09-22 MED ORDER — SUFENTANIL CITRATE 50 MCG/ML IV SOLN
INTRAVENOUS | Status: DC | PRN
  Administered 2014-09-22: 10 ug via INTRAVENOUS
  Administered 2014-09-22: 20 ug via INTRAVENOUS
  Administered 2014-09-22 (×2): 10 ug via INTRAVENOUS

## 2014-09-22 MED ORDER — CEFAZOLIN SODIUM-DEXTROSE 2-3 GM-% IV SOLR
INTRAVENOUS | Status: AC
  Filled 2014-09-22: qty 50

## 2014-09-22 MED ORDER — PROMETHAZINE HCL 25 MG/ML IJ SOLN
INTRAMUSCULAR | Status: AC
  Filled 2014-09-22: qty 1

## 2014-09-22 MED ORDER — ONDANSETRON HCL 4 MG/2ML IJ SOLN
INTRAMUSCULAR | Status: AC
  Filled 2014-09-22: qty 2

## 2014-09-22 MED ORDER — DEXAMETHASONE SODIUM PHOSPHATE 10 MG/ML IJ SOLN
INTRAMUSCULAR | Status: DC | PRN
  Administered 2014-09-22: 10 mg via INTRAVENOUS

## 2014-09-22 MED ORDER — ACETAMINOPHEN 325 MG PO TABS: 650.0000 mg | ORAL_TABLET | ORAL | Status: DC | PRN

## 2014-09-22 MED ORDER — NEOSTIGMINE METHYLSULFATE 10 MG/10ML IV SOLN
INTRAVENOUS | Status: AC
  Filled 2014-09-22: qty 1

## 2014-09-22 MED ORDER — PROPOFOL 10 MG/ML IV BOLUS
INTRAVENOUS | Status: DC | PRN
  Administered 2014-09-22: 160 mg via INTRAVENOUS

## 2014-09-22 MED ORDER — OXYMETAZOLINE HCL 0.05 % NA SOLN
NASAL | Status: AC
  Filled 2014-09-22: qty 15

## 2014-09-22 MED ORDER — SODIUM CHLORIDE 0.9 % IJ SOLN: 3.0000 mL | Freq: Two times a day (BID) | INTRAMUSCULAR | Status: DC

## 2014-09-22 MED ORDER — HYDROMORPHONE HCL 1 MG/ML IJ SOLN
0.2500 mg | INTRAMUSCULAR | Status: DC | PRN
  Administered 2014-09-22 (×2): 0.25 mg via INTRAVENOUS

## 2014-09-22 MED ORDER — MIDAZOLAM HCL 5 MG/5ML IJ SOLN
INTRAMUSCULAR | Status: DC | PRN
  Administered 2014-09-22: 2 mg via INTRAVENOUS

## 2014-09-22 MED ORDER — GLYCOPYRROLATE 0.2 MG/ML IJ SOLN
INTRAMUSCULAR | Status: AC
  Filled 2014-09-22: qty 3

## 2014-09-22 MED ORDER — LIDOCAINE HCL (CARDIAC) 20 MG/ML IV SOLN
INTRAVENOUS | Status: DC | PRN
  Administered 2014-09-22: 100 mg via INTRAVENOUS

## 2014-09-22 MED ORDER — KETOROLAC TROMETHAMINE 15 MG/ML IJ SOLN
INTRAMUSCULAR | Status: AC
  Filled 2014-09-22: qty 1

## 2014-09-22 MED ORDER — STERILE WATER FOR IRRIGATION IR SOLN
Status: DC | PRN
Start: 2014-09-22 — End: 2014-09-22
  Administered 2014-09-22: 3000 mL

## 2014-09-22 MED ORDER — OXYMETAZOLINE HCL 0.05 % NA SOLN
2.0000 | Freq: Once | NASAL | Status: AC
  Administered 2014-09-22: 2 via NASAL
  Filled 2014-09-22: qty 15

## 2014-09-22 MED ORDER — SODIUM CHLORIDE 0.9 % IV SOLN: 250.0000 mL | INTRAVENOUS | Status: DC | PRN

## 2014-09-22 MED ORDER — OXYCODONE-ACETAMINOPHEN 5-325 MG PO TABS: 2.0000 | ORAL_TABLET | Freq: Four times a day (QID) | ORAL | Status: DC | PRN

## 2014-09-22 MED ORDER — CEFAZOLIN SODIUM-DEXTROSE 2-3 GM-% IV SOLR
2.0000 g | INTRAVENOUS | Status: AC
  Administered 2014-09-22: 2 g via INTRAVENOUS

## 2014-09-22 MED ORDER — CISATRACURIUM BESYLATE 20 MG/10ML IV SOLN
INTRAVENOUS | Status: AC
  Filled 2014-09-22: qty 10

## 2014-09-22 MED ORDER — LACTATED RINGERS IV SOLN
INTRAVENOUS | Status: DC
  Administered 2014-09-22: 1000 mL via INTRAVENOUS
  Administered 2014-09-22: 16:00:00 via INTRAVENOUS
  Administered 2014-09-22: 1000 mL via INTRAVENOUS

## 2014-09-22 MED ORDER — MIDAZOLAM HCL 2 MG/2ML IJ SOLN
INTRAMUSCULAR | Status: AC
  Filled 2014-09-22: qty 2

## 2014-09-22 MED ORDER — OXYCODONE HCL 5 MG PO TABS: 5.0000 mg | ORAL_TABLET | ORAL | Status: DC | PRN

## 2014-09-22 MED ORDER — EPHEDRINE SULFATE 50 MG/ML IJ SOLN
INTRAMUSCULAR | Status: AC
  Filled 2014-09-22: qty 1

## 2014-09-22 MED ORDER — SUCCINYLCHOLINE CHLORIDE 20 MG/ML IJ SOLN
INTRAMUSCULAR | Status: DC | PRN
  Administered 2014-09-22: 100 mg via INTRAVENOUS

## 2014-09-22 MED ORDER — HYDROMORPHONE HCL 1 MG/ML IJ SOLN
INTRAMUSCULAR | Status: AC
  Filled 2014-09-22: qty 1

## 2014-09-22 MED ORDER — LACTATED RINGERS IR SOLN
Status: DC | PRN
  Administered 2014-09-22: 1

## 2014-09-22 MED ORDER — CISATRACURIUM BESYLATE (PF) 10 MG/5ML IV SOLN
INTRAVENOUS | Status: DC | PRN
  Administered 2014-09-22: 4 mg via INTRAVENOUS
  Administered 2014-09-22: 10 mg via INTRAVENOUS

## 2014-09-22 MED ORDER — ACETAMINOPHEN 650 MG RE SUPP
650.0000 mg | RECTAL | Status: DC | PRN
  Filled 2014-09-22: qty 1

## 2014-09-22 MED ORDER — NEOSTIGMINE METHYLSULFATE 10 MG/10ML IV SOLN
INTRAVENOUS | Status: DC | PRN
  Administered 2014-09-22: 5 mg via INTRAVENOUS

## 2014-09-22 MED ORDER — GLYCOPYRROLATE 0.2 MG/ML IJ SOLN
INTRAMUSCULAR | Status: AC
  Filled 2014-09-22: qty 1

## 2014-09-22 SURGICAL SUPPLY — 44 items
CHLORAPREP W/TINT 26ML (MISCELLANEOUS) ×2 IMPLANT
CORDS BIPOLAR (ELECTRODE) ×2 IMPLANT
COVER SURGICAL LIGHT HANDLE (MISCELLANEOUS) ×2 IMPLANT
COVER TIP SHEARS 8 DVNC (MISCELLANEOUS) ×1 IMPLANT
COVER TIP SHEARS 8MM DA VINCI (MISCELLANEOUS) ×1
DRAPE SHEET LG 3/4 BI-LAMINATE (DRAPES) ×4 IMPLANT
DRAPE SURG IRRIG POUCH 19X23 (DRAPES) ×2 IMPLANT
DRAPE TABLE BACK 44X90 PK DISP (DRAPES) ×4 IMPLANT
DRAPE WARM FLUID 44X44 (DRAPE) ×2 IMPLANT
DRSG TEGADERM 6X8 (GAUZE/BANDAGES/DRESSINGS) ×8 IMPLANT
ELECT REM PT RETURN 9FT ADLT (ELECTROSURGICAL) ×2
ELECTRODE REM PT RTRN 9FT ADLT (ELECTROSURGICAL) ×1 IMPLANT
GLOVE BIO SURGEON STRL SZ 6 (GLOVE) ×6 IMPLANT
GLOVE BIO SURGEON STRL SZ 6.5 (GLOVE) ×4 IMPLANT
GOWN STRL REUS W/ TWL LRG LVL3 (GOWN DISPOSABLE) ×2 IMPLANT
GOWN STRL REUS W/TWL LRG LVL3 (GOWN DISPOSABLE) ×2
HOLDER FOLEY CATH W/STRAP (MISCELLANEOUS) ×2 IMPLANT
KIT ACCESSORY DA VINCI DISP (KITS) ×1
KIT ACCESSORY DVNC DISP (KITS) ×1 IMPLANT
KIT BASIN OR (CUSTOM PROCEDURE TRAY) ×2 IMPLANT
LIQUID BAND (GAUZE/BANDAGES/DRESSINGS) ×2 IMPLANT
MANIPULATOR UTERINE 4.5 ZUMI (MISCELLANEOUS) ×2 IMPLANT
OCCLUDER COLPOPNEUMO (BALLOONS) ×2 IMPLANT
PEN SKIN MARKING BROAD (MISCELLANEOUS) ×2 IMPLANT
POUCH SPECIMEN RETRIEVAL 10MM (ENDOMECHANICALS) ×6 IMPLANT
SEAL CANN UNIV 5-8 DVNC XI (MISCELLANEOUS) ×4 IMPLANT
SEAL XI 5MM-8MM UNIVERSAL (MISCELLANEOUS) ×4
SET TUBE IRRIG SUCTION NO TIP (IRRIGATION / IRRIGATOR) ×2 IMPLANT
SHEET LAVH (DRAPES) ×2 IMPLANT
SOLUTION ELECTROLUBE (MISCELLANEOUS) ×2 IMPLANT
SUT VIC AB 0 CT1 27 (SUTURE) ×1
SUT VIC AB 0 CT1 27XBRD ANTBC (SUTURE) ×1 IMPLANT
SUT VIC AB 4-0 PS2 27 (SUTURE) ×4 IMPLANT
SUT VICRYL 0 UR6 27IN ABS (SUTURE) ×2 IMPLANT
SYR 50ML LL SCALE MARK (SYRINGE) ×2 IMPLANT
TOWEL OR 17X26 10 PK STRL BLUE (TOWEL DISPOSABLE) ×4 IMPLANT
TOWEL OR NON WOVEN STRL DISP B (DISPOSABLE) ×2 IMPLANT
TRAP SPECIMEN MUCOUS 40CC (MISCELLANEOUS) ×2 IMPLANT
TRAY FOLEY W/METER SILVER 14FR (SET/KITS/TRAYS/PACK) ×2 IMPLANT
TRAY LAPAROSCOPIC (CUSTOM PROCEDURE TRAY) ×2 IMPLANT
TROCAR BLADELESS OPT 5 100 (ENDOMECHANICALS) ×2 IMPLANT
TROCAR XCEL 12X100 BLDLESS (ENDOMECHANICALS) ×2 IMPLANT
TUBING INSUFFLATION 10FT LAP (TUBING) ×2 IMPLANT
WATER STERILE IRR 1500ML POUR (IV SOLUTION) IMPLANT

## 2014-09-22 NOTE — Anesthesia Postprocedure Evaluation (Signed)
  Anesthesia Post-op Note  Patient: Sheila Galloway  Procedure(s) Performed: Procedure(s) (LRB): ROBOTIC ASSISTED BILATERAL SALPINGO OOPHORECTOMY PERITONEAL WASHING (Bilateral)  Patient Location: PACU  Anesthesia Type: General  Level of Consciousness: awake and alert   Airway and Oxygen Therapy: Patient Spontanous Breathing  Post-op Pain: mild  Post-op Assessment: Post-op Vital signs reviewed, Patient's Cardiovascular Status Stable, Respiratory Function Stable, Patent Airway and No signs of Nausea or vomiting  Last Vitals:  Filed Vitals:   09/22/14 1715  BP: 148/75  Pulse: 80  Temp:   Resp: 11    Post-op Vital Signs: stable   Complications: No apparent anesthesia complications \

## 2014-09-22 NOTE — H&P (View-Only) (Signed)
Consult Note: Gyn-Onc  Consult was requested by Dr. Stann Mainland or the evaluation of Sheila Galloway 54 y.o. female with bilateral ovarian cysts  CC:  Chief Complaint  Patient presents with  . Right Ovarian Cyst    has moved into upper abdomen    Assessment/Plan:  Ms. Sheila Galloway  is a 54 y.o.  year old with bilateral ovarian cysts and a mildly elevated CA 125.  I performed a history, physical examination, and personally reviewed the patient's imaging films including the CT scan from 08/07/14. I agree with the suspicion that these are most likely benign ovarian cysts given their uniform regularity and relatively simple appearance on imaging. However given the large size of particular the right ovarian cyst, and the mild elevation in CA-125 I am recommending surgical removal and frozen section pathology with staging if indicated. I did extensive discussion with Clarke and her husband regarding surgical options. I expressed that it felt that the right ovary needed to be removed in entirety. The left ovary could potentially be spared and she could have a left ovarian cystectomy if she preferred. This would facilitate her preserving hormonal status until a more natural age of menopause, which is been associated with overall prolonged life expectancy compared to surgical menopause prior to age 31. However, given her strong family history for breast cancer, has some concern that she may harbor a predisposition for breast and ovarian cancer which is leading me to my final recommendation of BSO. The patient is in agreement with proceeding with robotic assisted laparoscopic BSO, possible staging including possible hysterectomy malignancy is found.  I discussed operative risks with the patient including  bleeding, infection, damage to internal organs (such as bladder,ureters, bowels), blood clot, reoperation and rehospitalization. We discussed use of postop HRT for <5years if surgical menopause symptoms are  severe.  HPI: Sheila Galloway is a 54 year old gravida 3 para 3 who is seen in consultation at the request of Dr. Stann Mainland for bilateral ovarian cysts and elevated CA-125. The patient was seen by Dr. Stann Mainland in March for routine annual examination. On pelvic examination a cystic mass was appreciated. This prompted a CT scan of the abdomen and pelvis on 08/07/2014. This revealed bilateral regular unilocular ovarian cysts, the large on the right measuring 9 cm in greatest dimension and on the left measuring 4 x 3 cm. Were no signs on CT concerning for metastatic ovarian cancer including no lymphadenopathy, no ascites, no peritoneal carcinomatosis.  A CA-125 was drawn on 09/08/2014 and was slightly elevated at 59 units per milliliter.  The patient has no symptoms concerning for metastatic ovarian cancer including no bloating, no early satiety, no pelvic pain or abdominal pain, and no change in bladder or bowel habit. She is a healthy woman with prior surgeries including a cholecystectomy and cesarean section. She has a strong maternal family history for breast cancer, all postmenopausal age, in her mother, maternal aunt, and grandmother. Her mother also has a history of colon cancer.     Current Meds:  Outpatient Encounter Prescriptions as of 09/18/2014  Medication Sig  . acetaminophen (TYLENOL) 325 MG tablet Take 650 mg by mouth every 6 (six) hours as needed.  . diclofenac (VOLTAREN) 75 MG EC tablet   . nitrofurantoin, macrocrystal-monohydrate, (MACROBID) 100 MG capsule Take 100 mg by mouth as needed.  . pseudoephedrine (SUDAFED) 30 MG tablet Take 30 mg by mouth every 4 (four) hours as needed for congestion.    Allergy:  Allergies  Allergen Reactions  .  Zoloft [Sertraline Hcl] Hives    Social Hx:   History   Social History  . Marital Status: Married    Spouse Name: N/A  . Number of Children: N/A  . Years of Education: N/A   Occupational History  . Not on file.   Social History Main Topics   . Smoking status: Never Smoker   . Smokeless tobacco: Not on file  . Alcohol Use: Not on file  . Drug Use: No  . Sexual Activity: Yes   Other Topics Concern  . Not on file   Social History Narrative  . No narrative on file    Past Surgical Hx: History reviewed. No pertinent past surgical history.  Past Medical Hx: History reviewed. No pertinent past medical history.  Past Gynecological History:  No LMP recorded. CS x 1, SVD x 2, IUD in place (paraguard).  Family Hx:  Family History  Problem Relation Age of Onset  . Cancer Mother     breast and colon  . Cancer Maternal Aunt     postmenopausal breast  . Cancer Maternal Grandmother     postmenopausal breast    Review of Systems:  Constitutional  Feels well,    ENT Normal appearing ears and nares bilaterally Skin/Breast  No rash, sores, jaundice, itching, dryness Cardiovascular  No chest pain, shortness of breath, or edema  Pulmonary  No cough or wheeze.  Gastro Intestinal  No nausea, vomitting, or diarrhoea. No bright red blood per rectum, no abdominal pain, change in bowel movement, or constipation.  Genito Urinary  No frequency, urgency, dysuria, see HPI Musculo Skeletal  No myalgia, arthralgia, joint swelling or pain  Neurologic  No weakness, numbness, change in gait,  Psychology  No depression, anxiety, insomnia.   Vitals:  Blood pressure 139/73, pulse 94, temperature 98.2 F (36.8 C), temperature source Oral, resp. rate 18, height 5\' 2"  (1.575 m), weight 172 lb 4.8 oz (78.155 kg).  Physical Exam: WD in NAD Neck  Supple NROM, without any enlargements.  Lymph Node Survey No cervical supraclavicular or inguinal adenopathy Cardiovascular  Pulse normal rate, regularity and rhythm. S1 and S2 normal.  Lungs  Clear to auscultation bilateraly, without wheezes/crackles/rhonchi. Good air movement.  Skin  No rash/lesions/breakdown  Psychiatry  Alert and oriented to person, place, and time  Abdomen   Normoactive bowel sounds, abdomen soft, non-tender and obese without evidence of hernia.  Back No CVA tenderness Genito Urinary  Vulva/vagina: Normal external female genitalia.   No lesions. No discharge or bleeding.  Bladder/urethra:  No lesions or masses, well supported bladder  Vagina: normal, some prolapse  Cervix: Normal appearing, no lesions.  Uterus:  Small, mobile, no parametrial involvement or nodularity.  Adnexa: smooth regular mass appreciated in right pelvic abdominal region, mobile.  Rectal  Good tone, no masses no cul de sac nodularity.  Extremities  No bilateral cyanosis, clubbing or edema.   [name], MD   09/18/2014, 3:29 PM

## 2014-09-22 NOTE — Interval H&P Note (Signed)
History and Physical Interval Note:  09/22/2014 1:43 PM  [name]  has presented today for surgery, with the diagnosis of bilateral ovarian cyst  The various methods of treatment have been discussed with the patient and family. After consideration of risks, benefits and other options for treatment, the patient has consented to  Procedure(s): ROBOTIC ASSISTED BILATERAL SALPINGO OOPHORECTOMY (Bilateral) as a surgical intervention .  The patient's history has been reviewed, patient examined, no change in status, stable for surgery.  I have reviewed the patient's chart and labs.  Questions were answered to the patient's satisfaction.     [name]

## 2014-09-22 NOTE — Discharge Instructions (Signed)
09/22/2014  Return to work: 2 weeks  Activity: 1. Be up and out of the bed during the day.  Take a nap if needed.  You may walk up steps but be careful and use the hand rail.  Stair climbing will tire you more than you think, you may need to stop part way and rest.   2. No lifting or straining for 6 weeks.  3. No driving for 1-2 weeks.  Do Not drive if you are taking narcotic pain medicine.  4. Shower daily.  Use soap and water on your incision and pat dry; don't rub.   5. No sexual activity and nothing in the vagina for 4 weeks.  Diet: 1. Low sodium Heart Healthy Diet is recommended.  2. It is safe to use a laxative if you have difficulty moving your bowels.   Wound Care: 1. Keep clean and dry.  Shower daily.  Reasons to call the Doctor:   Fever - Oral temperature greater than 100.4 degrees Fahrenheit  Foul-smelling vaginal discharge  Difficulty urinating  Nausea and vomiting  Increased pain at the site of the incision that is unrelieved with pain medicine.  Difficulty breathing with or without chest pain  New calf pain especially if only on one side  Sudden, continuing increased vaginal bleeding with or without clots.   Follow-up: 1. See Everitt Amber in 4 weeks.  Contacts: For questions or concerns you should contact:  Dr. Everitt Amber at [phone]  or at [place]  Bilateral Salpingo-Oophorectomy, Care After Refer to this sheet in the next few weeks. These instructions provide you with information on caring for yourself after your procedure. Your health care provider may also give you more specific instructions. Your treatment has been planned according to current medical practices, but problems sometimes occur. Call your health care provider if you have any problems or questions after your procedure. WHAT TO EXPECT AFTER THE PROCEDURE After your procedure, it is typical to have the following:   Abdominal pain that can be controlled with  medicine.  Vaginal spotting.  Constipation.  Menopausal symptoms such as hot flashes, vaginal dryness, and mood swings. HOME CARE INSTRUCTIONS   Get plenty of rest and sleep.  Only take over-the-counter or prescription medicines as directed by your health care provider. Do not take aspirin. It can cause bleeding.  Keep incision areas clean and dry. Remove or change bandages (dressings) only as directed by your health care provider.  Take showers instead of baths for a few weeks as directed by your health care provider.  Limit exercise and activities as directed by your health care provider. Do not lift anything heavier than 5 pounds (2.3 kg) until your health care provider approves.  Do not drive until your health care provider approves.  Follow your health care provider's advice regarding diet. You may be able to resume your usual diet right away.  Drink enough fluids to keep your urine clear or pale yellow.  Do not douche, use tampons, or have sexual intercourse for 6 weeks after the procedure.  Do not drink alcohol until your health care provider says it is okay.  Take your temperature twice a day and write it down.  If you become constipated, you may:  Ask your health care provider about taking a mild laxative.  Add more fruit and bran to your diet.  Drink more fluids.  Follow up with your health care provider as directed. SEEK MEDICAL CARE IF:   You have swelling, redness,  or increasing pain in the incision area.  You see pus coming from the incision area.  You notice a bad smell coming from the wound or dressing.  You have pain, redness, or swelling where the IV access tube was placed.  Your incision is breaking open (the edges are not staying together).  You feel dizzy or feel like fainting.  You develop pain or bleeding when you urinate.  You develop diarrhea.  You develop nausea and vomiting.  You develop abnormal vaginal discharge.  You develop  a rash.  You have pain that is not controlled with medicine. SEEK IMMEDIATE MEDICAL CARE IF:   You develop a fever.  You develop abdominal pain.  You have chest pain.  You develop shortness of breath.  You pass out.  You develop pain, swelling, or redness in your leg.  You develop heavy vaginal bleeding with or without blood clots. Document Released: 05/08/2005 Document Revised: 01/08/2013 Document Reviewed: 10/30/2012 [facility] Patient Information 2015 [place], [place]. This information is not intended to replace advice given to you by your health care provider. Make sure you discuss any questions you have with your health care provider.

## 2014-09-22 NOTE — Anesthesia Preprocedure Evaluation (Addendum)
Anesthesia Evaluation  Patient identified by MRN, date of birth, ID band Patient awake    Reviewed: Allergy & Precautions, NPO status , Patient's Chart, lab work & pertinent test results  Airway Mallampati: II  TM Distance: >3 FB Neck ROM: Full    Dental no notable dental hx.    Pulmonary neg pulmonary ROS,  breath sounds clear to auscultation  Pulmonary exam normal       Cardiovascular negative cardio ROS  Rhythm:Regular Rate:Normal     Neuro/Psych negative neurological ROS  negative psych ROS   GI/Hepatic negative GI ROS, Neg liver ROS,   Endo/Other  negative endocrine ROS  Renal/GU negative Renal ROS  negative genitourinary   Musculoskeletal negative musculoskeletal ROS (+)   Abdominal   Peds negative pediatric ROS (+)  Hematology negative hematology ROS (+)   Anesthesia Other Findings   Reproductive/Obstetrics negative OB ROS                             Anesthesia Physical Anesthesia Plan  ASA: II  Anesthesia Plan: General   Post-op Pain Management:    Induction: Intravenous  Airway Management Planned: Oral ETT  Additional Equipment:   Intra-op Plan:   Post-operative Plan: Extubation in OR  Informed Consent: I have reviewed the patients History and Physical, chart, labs and discussed the procedure including the risks, benefits and alternatives for the proposed anesthesia with the patient or authorized representative who has indicated his/her understanding and acceptance.   Dental advisory given  Plan Discussed with: CRNA  Anesthesia Plan Comments:         Anesthesia Quick Evaluation

## 2014-09-22 NOTE — Transfer of Care (Signed)
Immediate Anesthesia Transfer of Care Note  Patient: Sheila Galloway  Procedure(s) Performed: Procedure(s): ROBOTIC ASSISTED BILATERAL SALPINGO OOPHORECTOMY PERITONEAL WASHING (Bilateral)  Patient Location: PACU  Anesthesia Type:General  Level of Consciousness: awake  Airway & Oxygen Therapy: Patient Spontanous Breathing and Patient connected to face mask oxygen  Post-op Assessment: Report given to RN and Post -op Vital signs reviewed and stable  Post vital signs: Reviewed and stable  Last Vitals:  Filed Vitals:   09/22/14 1333  BP: 151/76  Pulse: 89  Temp: 36.8 C  Resp: 16    Complications: No apparent anesthesia complications

## 2014-09-22 NOTE — Anesthesia Procedure Notes (Signed)
Procedure Name: Intubation Date/Time: 09/22/2014 2:38 PM Performed by: Danley Danker L Patient Re-evaluated:Patient Re-evaluated prior to inductionOxygen Delivery Method: Circle system utilized Preoxygenation: Pre-oxygenation with 100% oxygen Intubation Type: IV induction and Cricoid Pressure applied Ventilation: Mask ventilation without difficulty Laryngoscope Size: Miller and 3 Grade View: Grade I Tube type: Oral Tube size: 7.5 mm Number of attempts: 1 Airway Equipment and Method: Stylet Placement Confirmation: ETT inserted through vocal cords under direct vision,  positive ETCO2 and breath sounds checked- equal and bilateral Secured at: 20 cm Tube secured with: Tape Dental Injury: Teeth and Oropharynx as per pre-operative assessment

## 2014-09-22 NOTE — Op Note (Signed)
OPERATIVE NOTE  Date: 09/22/2014  Preoperative Diagnosis: bilateral ovarian masses   Postoperative Diagnosis:  Benign ovarian cystadenomas  Procedure(s) Performed: Robotic-assisted laparoscopic bilateral salpingo-oophorectomy  Surgeon: Everitt Amber, M.D.  Assistant Surgeon: Lahoma Crocker M.D. (an MD assistant was necessary for tissue manipulation, management of robotic instrumentation, retraction and positioning due to the complexity of the case and hospital policies).   Anesthesia: Gen. endotracheal.  Specimens: Bilateral ovaries, fallopian tubes, pelvic washings, right uterosacral ligament  Estimated Blood Loss: 25 mL. Blood Replacement: None  Complications: none  Indication for Procedure:  Right ovarian cyst measuring 9cm, left ovarian cyst measuring 3cm, elevated CA 125  Operative Findings: simple right ovarian cyst measuring 10cm (cystadenoma on frozen section), benign appearing left ovarian cyst. Normal uterus. Endometriotic scar on right uterosacral ligament.  Frozen pathology was consistent with benign cystadenoma  Procedure: The patient's taken to the operating room and placed under general endotracheal anesthesia testing difficulty. She is placed in a dorsolithotomy position and cervical acromial pad was placed. The arms were tucked with care taken to pad the olecranon process. And prepped and draped in usual sterile fashion. A uterine manipulator (zumi) was placed vaginally. A 45mm incision was made in the left upper quadrant palmer's point and a 5 mm Optiview trocar used to enter the abdomen under direct visualization. With entry into the abdomen and then maintenance of 15 mm of mercury the patient was placed in Trendelenburg position. An incision was made in the umbilicus and a 37SE trochar was placed through this site. Two incisions were made lateral to the umbilical incision in the left and right abdomen measuring 31mm. These incisions were made approximately 10 cm lateral  to the umbilical incision. 8 mm robotic trochars were inserted. The robot was docked.  The abdomen was inspected as was the pelvis.  Pelvic washings were obtained. An incision was made on the right pelvic side wall peritoneum parallel to the IP ligament and the retroperitoneal space entered. The right ureter was identified and the para-rectal space was developed. A window was created in the right broad ligament above the ureter. The right infundibulopelvic vessels were skeletonized cauterized and transected. The utero-ovarian ligaments similarly were cauterized and transected. Specimen was drained of its simple clear fluid and placed in an Endo Catch bag. It was sent for frozen section which was benign.  In a similar manner the left peritoneum and the side wall was incised, and the retroperitoneal space entered. The left ureter was identified and the left pararectal space was developed. The utero-ovarian ligament was skeletonized cauterized and transected. The left utero-ovarian ligaments were cauterized and transected in the left adnexa was placed in an Endo Catch bag.  The right uterosacral ligament lesion was excised with sharp dissection with care to only take the peritoneal layer. The abdomen was copiously irrigated and drained and all operative sites inspected and hemostasis was assured.  The robot was undocked.  The ports were all remove. The fascial closure at the umbilical incision and left upper quadrant port was made with 0 Vicryl.  All incisions were closed with a running subcuticular Monocryl suture. Dermabond was applied. Sponge, lap and needle counts were correct x 3.    The patient had sequential compression devices for VTE prophylaxis.         Disposition: PACU         Condition: stable  Sheila Eva, MD

## 2014-09-23 ENCOUNTER — Encounter (HOSPITAL_COMMUNITY): Payer: Self-pay | Admitting: Gynecologic Oncology

## 2014-10-13 ENCOUNTER — Ambulatory Visit: Payer: BLUE CROSS/BLUE SHIELD | Attending: Gynecologic Oncology | Admitting: Gynecologic Oncology

## 2014-10-13 ENCOUNTER — Encounter: Payer: Self-pay | Admitting: Gynecologic Oncology

## 2014-10-13 ENCOUNTER — Telehealth: Payer: Self-pay | Admitting: Nurse Practitioner

## 2014-10-13 VITALS — BP 143/83 | HR 70 | Temp 98.3°F | Resp 18 | Ht 62.0 in | Wt 166.7 lb

## 2014-10-13 DIAGNOSIS — N8329 Other ovarian cysts: Secondary | ICD-10-CM | POA: Diagnosis present

## 2014-10-13 DIAGNOSIS — N83202 Unspecified ovarian cyst, left side: Secondary | ICD-10-CM

## 2014-10-13 DIAGNOSIS — N8320 Unspecified ovarian cysts: Secondary | ICD-10-CM

## 2014-10-13 DIAGNOSIS — G479 Sleep disorder, unspecified: Secondary | ICD-10-CM | POA: Diagnosis not present

## 2014-10-13 DIAGNOSIS — N83201 Unspecified ovarian cyst, right side: Secondary | ICD-10-CM

## 2014-10-13 MED ORDER — ZOLPIDEM TARTRATE 5 MG PO TABS: 5.0000 mg | ORAL_TABLET | Freq: Every evening | ORAL | Status: DC | PRN

## 2014-10-13 NOTE — Progress Notes (Signed)
POSTOPERATIVE FOLLOWUP VISIT  Assessment:    54 y.o. year old with benign ovarian cysts.   S/p robotic bilateral salpingectomy on 09/22/14.   Plan: 1) Pathology reports reviewed today 2) Treatment counseling - No further interventions required. I offered her to have her IUD removed today, however she declined. She was given the opportunity to ask questions, which were answered to her satisfaction, and she is agreement with the above mentioned plan of care.  3)  Return to clinic on a prn basis. She will return to Dr Stann Mainland in 1 year for routine well-woman followup.   HPI:  Sheila Galloway is a 54 y.o. year old initially seen in consultation on 09/18/14 for bilateral ovarian masses.  She then underwent a robotic BSO on 05/27/36 without complications.  Her postoperative course was uncomplicated.  Her final pathology revealed benign ovarian cysts (serous cystadenoma on the right and corpus luteum on the left).  She is seen today for a postoperative check and to discuss her pathology results and ongoing plan.  Since discharge from the hospital, she is feeling well.  She has improving appetite, normal bowel and bladder function, and pain controlled with minimal PO medication. She has no other complaints today other than mild sleep disturbance.    Review of systems: Constitutional:  She has no weight gain or weight loss. She has no fever or chills. Eyes: No blurred vision Ears, Nose, Mouth, Throat: No dizziness, headaches or changes in hearing. No mouth sores. Cardiovascular: No chest pain, palpitations or edema. Respiratory:  No shortness of breath, wheezing or cough Gastrointestinal: She has normal bowel movements without diarrhea or constipation. She denies any nausea or vomiting. She denies blood in her stool or heart burn. Genitourinary:  She denies pelvic pain, pelvic pressure or changes in her urinary function. She has no hematuria, dysuria, or incontinence. She has no irregular vaginal  bleeding or vaginal discharge Musculoskeletal: Denies muscle weakness or joint pains.  Skin:  She has no skin changes, rashes or itching Neurological:  Denies dizziness or headaches. No neuropathy, no numbness or tingling. Psychiatric:  She denies depression or anxiety. Hematologic/Lymphatic:   No easy bruising or bleeding   Physical Exam: Blood pressure 143/83, pulse 70, temperature 98.3 F (36.8 C), temperature source Oral, resp. rate 18, height 5\' 2"  (1.575 m), weight 166 lb 11.2 oz (75.615 kg), last menstrual period 09/04/2014. General: Well dressed, well nourished in no apparent distress.   HEENT:  Normocephalic and atraumatic, no lesions.  Extraocular muscles intact. Sclerae anicteric. Pupils equal, round, reactive. No mouth sores or ulcers. Thyroid is normal size, not nodular, midline. Skin:  No lesions or rashes. Breasts:  deferred Lungs:  deferred Cardiovascular:  deferred Abdomen:  Soft, nontender, nondistended.  No palpable masses.  No hepatosplenomegaly.  No ascites. Normal bowel sounds.  No hernias.  Incisions are well healed Genitourinary: deferred Extremities: No cyanosis, clubbing or edema.  No calf tenderness or erythema. No palpable cords. Psychiatric: Mood and affect are appropriate. Neurological: Awake, alert and oriented x 3. Sensation is intact, no neuropathy.  Musculoskeletal: No pain, normal strength and range of motion.  [name], MD

## 2014-10-13 NOTE — Patient Instructions (Signed)
Please follow up with your regular appointments with Dr. Stann Mainland. You do not need to schedule a follow up appointment in our office at this time. Please call us with any concerns or questions in the future.

## 2014-10-13 NOTE — Telephone Encounter (Signed)
Patient left clinic without hard copy of ambien prescription. Offered to reprint and have it waiting for her to pick up at her convenience. She states she wants to hold off on taking the medication to see if normal work schedule and routine will help her sleep better without medication. She will call clinic and let us know when she is ready for this medication and will come back to pick up rx. She knows to call with any other questions or needs in the meantime.

## 2015-04-06 ENCOUNTER — Other Ambulatory Visit: Payer: Self-pay | Admitting: Gastroenterology

## 2015-07-09 ENCOUNTER — Other Ambulatory Visit: Payer: Self-pay

## 2015-07-09 DIAGNOSIS — Z1231 Encounter for screening mammogram for malignant neoplasm of breast: Secondary | ICD-10-CM

## 2015-08-02 ENCOUNTER — Ambulatory Visit
Admission: RE | Admit: 2015-08-02 | Discharge: 2015-08-02 | Disposition: A | Discharge status: Home / Self Care | Payer: BLUE CROSS/BLUE SHIELD | Source: Ambulatory Visit

## 2015-08-02 DIAGNOSIS — Z1231 Encounter for screening mammogram for malignant neoplasm of breast: Secondary | ICD-10-CM

## 2015-08-09 ENCOUNTER — Other Ambulatory Visit: Payer: Self-pay | Admitting: Obstetrics & Gynecology

## 2015-08-10 LAB — CYTOLOGY - PAP

## 2016-09-01 ENCOUNTER — Other Ambulatory Visit: Payer: Self-pay | Admitting: Obstetrics & Gynecology

## 2016-09-01 DIAGNOSIS — Z1231 Encounter for screening mammogram for malignant neoplasm of breast: Secondary | ICD-10-CM

## 2016-09-20 ENCOUNTER — Ambulatory Visit
Admission: RE | Admit: 2016-09-20 | Discharge: 2016-09-20 | Disposition: A | Discharge status: Home / Self Care | Payer: BLUE CROSS/BLUE SHIELD | Source: Ambulatory Visit | Attending: Obstetrics & Gynecology | Admitting: Obstetrics & Gynecology

## 2016-09-20 ENCOUNTER — Encounter: Payer: Self-pay | Admitting: Radiology

## 2016-09-20 DIAGNOSIS — Z1231 Encounter for screening mammogram for malignant neoplasm of breast: Secondary | ICD-10-CM

## 2017-04-02 DIAGNOSIS — Z01419 Encounter for gynecological examination (general) (routine) without abnormal findings: Secondary | ICD-10-CM | POA: Diagnosis not present

## 2017-04-02 DIAGNOSIS — Z6830 Body mass index (BMI) 30.0-30.9, adult: Secondary | ICD-10-CM | POA: Diagnosis not present

## 2017-07-02 DIAGNOSIS — Z23 Encounter for immunization: Secondary | ICD-10-CM | POA: Diagnosis not present

## 2017-07-02 DIAGNOSIS — Z0000 Encounter for general adult medical examination without abnormal findings: Secondary | ICD-10-CM | POA: Diagnosis not present

## 2017-10-05 ENCOUNTER — Other Ambulatory Visit: Payer: Self-pay | Admitting: Obstetrics & Gynecology

## 2017-10-05 DIAGNOSIS — Z1231 Encounter for screening mammogram for malignant neoplasm of breast: Secondary | ICD-10-CM

## 2017-11-05 ENCOUNTER — Ambulatory Visit
Admission: RE | Admit: 2017-11-05 | Discharge: 2017-11-05 | Disposition: A | Discharge status: Home / Self Care | Payer: BLUE CROSS/BLUE SHIELD | Source: Ambulatory Visit | Attending: Obstetrics & Gynecology | Admitting: Obstetrics & Gynecology

## 2017-11-05 DIAGNOSIS — Z1231 Encounter for screening mammogram for malignant neoplasm of breast: Secondary | ICD-10-CM

## 2018-02-13 DIAGNOSIS — D485 Neoplasm of uncertain behavior of skin: Secondary | ICD-10-CM | POA: Diagnosis not present

## 2018-02-13 DIAGNOSIS — C44319 Basal cell carcinoma of skin of other parts of face: Secondary | ICD-10-CM | POA: Diagnosis not present

## 2018-02-13 DIAGNOSIS — L821 Other seborrheic keratosis: Secondary | ICD-10-CM | POA: Diagnosis not present

## 2018-02-13 DIAGNOSIS — D1801 Hemangioma of skin and subcutaneous tissue: Secondary | ICD-10-CM | POA: Diagnosis not present

## 2018-02-13 DIAGNOSIS — D225 Melanocytic nevi of trunk: Secondary | ICD-10-CM | POA: Diagnosis not present

## 2018-03-20 DIAGNOSIS — C44319 Basal cell carcinoma of skin of other parts of face: Secondary | ICD-10-CM | POA: Diagnosis not present

## 2018-04-09 DIAGNOSIS — Z01419 Encounter for gynecological examination (general) (routine) without abnormal findings: Secondary | ICD-10-CM | POA: Diagnosis not present

## 2018-04-09 DIAGNOSIS — Z124 Encounter for screening for malignant neoplasm of cervix: Secondary | ICD-10-CM | POA: Diagnosis not present

## 2018-04-09 DIAGNOSIS — Z6830 Body mass index (BMI) 30.0-30.9, adult: Secondary | ICD-10-CM | POA: Diagnosis not present

## 2018-04-25 ENCOUNTER — Telehealth: Payer: Self-pay | Admitting: *Deleted

## 2018-04-25 NOTE — Telephone Encounter (Signed)
Faxed records to [facility] has requested to [phone]

## 2018-11-13 DIAGNOSIS — Z1211 Encounter for screening for malignant neoplasm of colon: Secondary | ICD-10-CM | POA: Diagnosis not present

## 2018-11-13 DIAGNOSIS — M19041 Primary osteoarthritis, right hand: Secondary | ICD-10-CM | POA: Diagnosis not present

## 2018-11-13 DIAGNOSIS — Z0000 Encounter for general adult medical examination without abnormal findings: Secondary | ICD-10-CM | POA: Diagnosis not present

## 2018-11-13 DIAGNOSIS — E782 Mixed hyperlipidemia: Secondary | ICD-10-CM | POA: Diagnosis not present

## 2018-11-14 DIAGNOSIS — M19041 Primary osteoarthritis, right hand: Secondary | ICD-10-CM | POA: Diagnosis not present

## 2018-11-14 DIAGNOSIS — M19042 Primary osteoarthritis, left hand: Secondary | ICD-10-CM | POA: Diagnosis not present

## 2019-02-17 DIAGNOSIS — Z23 Encounter for immunization: Secondary | ICD-10-CM | POA: Diagnosis not present

## 2019-02-28 ENCOUNTER — Other Ambulatory Visit: Payer: Self-pay | Admitting: Obstetrics & Gynecology

## 2019-02-28 DIAGNOSIS — Z1231 Encounter for screening mammogram for malignant neoplasm of breast: Secondary | ICD-10-CM

## 2019-03-05 ENCOUNTER — Other Ambulatory Visit: Payer: Self-pay

## 2019-03-05 ENCOUNTER — Ambulatory Visit
Admission: RE | Admit: 2019-03-05 | Discharge: 2019-03-05 | Disposition: A | Discharge status: Home / Self Care | Payer: BC Managed Care – PPO | Source: Ambulatory Visit | Attending: Obstetrics & Gynecology | Admitting: Obstetrics & Gynecology

## 2019-03-05 DIAGNOSIS — Z1231 Encounter for screening mammogram for malignant neoplasm of breast: Secondary | ICD-10-CM

## 2019-04-21 DIAGNOSIS — Z01419 Encounter for gynecological examination (general) (routine) without abnormal findings: Secondary | ICD-10-CM | POA: Diagnosis not present

## 2019-04-21 DIAGNOSIS — Z6830 Body mass index (BMI) 30.0-30.9, adult: Secondary | ICD-10-CM | POA: Diagnosis not present

## 2019-08-08 ENCOUNTER — Ambulatory Visit: Attending: Internal Medicine

## 2019-08-08 DIAGNOSIS — Z23 Encounter for immunization: Secondary | ICD-10-CM

## 2019-08-08 NOTE — Progress Notes (Signed)
   Covid-19 Vaccination Clinic  Name:  Sheila Galloway    MRN: [id number] DOB: 08-08-1960  08/08/2019  Sheila Galloway was observed post Covid-19 immunization for 30 minutes based on pre-vaccination screening without incident. She was provided with Vaccine Information Sheet and instruction to access the V-Safe system.   Sheila Galloway was instructed to call 911 with any severe reactions post vaccine: Marland Kitchen Difficulty breathing  . Swelling of face and throat  . A fast heartbeat  . A bad rash all over body  . Dizziness and weakness   Immunizations Administered    Name Date Dose VIS Date Route   Pfizer COVID-19 Vaccine 08/08/2019 12:30 PM 0.3 mL 05/02/2019 Intramuscular   Manufacturer: [place]   Lot: [id number]   [place]: [id number]

## 2019-09-02 ENCOUNTER — Ambulatory Visit: Attending: Internal Medicine

## 2019-09-02 DIAGNOSIS — Z23 Encounter for immunization: Secondary | ICD-10-CM

## 2019-09-02 NOTE — Progress Notes (Signed)
   Covid-19 Vaccination Clinic  Name:  Sheila Galloway    MRN: [id number] DOB: 10-Aug-1960  09/02/2019  [name] was observed post Covid-19 immunization for 15 minutes without incident. She was provided with Vaccine Information Sheet and instruction to access the V-Safe system.   [name] was instructed to call 911 with any severe reactions post vaccine: Marland Kitchen Difficulty breathing  . Swelling of face and throat  . A fast heartbeat  . A bad rash all over body  . Dizziness and weakness   Immunizations Administered    Name Date Dose VIS Date Route   Pfizer COVID-19 Vaccine 09/02/2019  1:23 PM 0.3 mL 05/02/2019 Intramuscular   Manufacturer: [place]   Lot: [id number]   [place]: [id number]

## 2020-03-19 ENCOUNTER — Other Ambulatory Visit: Payer: Self-pay | Admitting: Obstetrics & Gynecology

## 2020-03-19 DIAGNOSIS — Z1231 Encounter for screening mammogram for malignant neoplasm of breast: Secondary | ICD-10-CM

## 2020-04-27 ENCOUNTER — Ambulatory Visit
Admission: RE | Admit: 2020-04-27 | Discharge: 2020-04-27 | Disposition: A | Discharge status: Home / Self Care | Payer: No Typology Code available for payment source | Source: Ambulatory Visit | Attending: Obstetrics & Gynecology | Admitting: Obstetrics & Gynecology

## 2020-04-27 ENCOUNTER — Other Ambulatory Visit: Payer: Self-pay

## 2020-04-27 DIAGNOSIS — Z1231 Encounter for screening mammogram for malignant neoplasm of breast: Secondary | ICD-10-CM

## 2020-07-20 ENCOUNTER — Ambulatory Visit (INDEPENDENT_AMBULATORY_CARE_PROVIDER_SITE_OTHER): Payer: No Typology Code available for payment source | Admitting: Podiatry

## 2020-07-20 ENCOUNTER — Encounter: Payer: Self-pay | Admitting: Podiatry

## 2020-07-20 ENCOUNTER — Other Ambulatory Visit: Payer: Self-pay

## 2020-07-20 ENCOUNTER — Ambulatory Visit (INDEPENDENT_AMBULATORY_CARE_PROVIDER_SITE_OTHER): Payer: No Typology Code available for payment source

## 2020-07-20 DIAGNOSIS — M2011 Hallux valgus (acquired), right foot: Secondary | ICD-10-CM | POA: Diagnosis not present

## 2020-07-20 DIAGNOSIS — N3020 Other chronic cystitis without hematuria: Secondary | ICD-10-CM | POA: Insufficient documentation

## 2020-07-20 DIAGNOSIS — M2012 Hallux valgus (acquired), left foot: Secondary | ICD-10-CM

## 2020-07-20 DIAGNOSIS — N649 Disorder of breast, unspecified: Secondary | ICD-10-CM | POA: Insufficient documentation

## 2020-07-20 DIAGNOSIS — R1900 Intra-abdominal and pelvic swelling, mass and lump, unspecified site: Secondary | ICD-10-CM | POA: Insufficient documentation

## 2020-07-20 NOTE — Progress Notes (Signed)
Subjective:  Patient ID: Sheila Galloway, female    DOB: 01-16-1961,  MRN: [id number] HPI Chief Complaint  Patient presents with  . Bunions     Bilateral bunions - history of bilateral bunion surgery 30+years ago - Right is doing OK, Left is very sore and almost back to original shape from years ago. Occasional toe cramping and curling    60 y.o. female presents with the above complaint.   ROS: Denies fever chills nausea vomiting muscle aches pains calf pain back pain chest pain shortness of breath.  Past Medical History:  Diagnosis Date  . Depression    hx of has been treated in past with counseling   . Headache    occas sinus headaches   . History of frequent urinary tract infections   . Ovarian cyst    2 to 3 years ago    Past Surgical History:  Procedure Laterality Date  . ANTERIOR CRUCIATE LIGAMENT REPAIR     left 2001  . BUNIONECTOMY     bilat right 1990 left 1991  . CESAREAN SECTION     1993  . CHOLECYSTECTOMY     2001  . colonscopy      5 years ago   . ROBOTIC ASSISTED SALPINGO OOPHERECTOMY Bilateral 09/22/2014   Procedure: ROBOTIC ASSISTED BILATERAL SALPINGO OOPHORECTOMY PERITONEAL WASHING;  Surgeon: Everitt Amber, MD;  Location: [facility];  Service: Gynecology;  Laterality: Bilateral;    Current Outpatient Medications:  .  carbamide peroxide (DEBROX) 6.5 % otic solution, Place 5 drops into both ears every 30 (thirty) days., Disp: , Rfl:  .  chlorpheniramine (CHLOR-TRIMETON) 4 MG tablet, Take 4 mg by mouth every 4 (four) hours as needed for allergies., Disp: , Rfl:  .  DENTA 5000 PLUS 1.1 % CREA dental cream, See admin instructions., Disp: , Rfl:  .  etodolac (LODINE) 400 MG tablet, etodolac 400 mg tablet, Disp: , Rfl:  .  fluticasone (FLONASE) 50 MCG/ACT nasal spray, Place 2 sprays into both nostrils at bedtime., Disp: , Rfl:  .  ibuprofen (ADVIL,MOTRIN) 200 MG tablet, Take 400 mg by mouth every 6 (six) hours as needed for headache or moderate pain., Disp: ,  Rfl:  .  meclizine (ANTIVERT) 25 MG tablet, meclizine 25 mg tablet, Disp: , Rfl:  .  meloxicam (MOBIC) 15 MG tablet, meloxicam 15 mg tablet, Disp: , Rfl:  .  nitrofurantoin, macrocrystal-monohydrate, (MACROBID) 100 MG capsule, Take 100 mg by mouth daily as needed (for uti prevention.). , Disp: , Rfl:  .  oxyCODONE-acetaminophen (PERCOCET) 5-325 MG per tablet, Take 2 tablets by mouth every 6 (six) hours as needed for severe pain., Disp: 30 tablet, Rfl: 0 .  PARoxetine (PAXIL) 10 MG tablet, Take 10 mg by mouth at bedtime., Disp: , Rfl:  .  Polyethyl Glycol-Propyl Glycol (SYSTANE OP), Apply 1-2 drops to eye daily as needed (dry eyes.)., Disp: , Rfl:  .  pseudoephedrine (SUDAFED) 30 MG tablet, Take 30 mg by mouth every 4 (four) hours as needed for congestion., Disp: , Rfl:  .  terbinafine (LAMISIL) 1 % cream, Apply 1 application topically every morning., Disp: , Rfl:  .  zolpidem (AMBIEN) 5 MG tablet, Take 1 tablet (5 mg total) by mouth at bedtime as needed for sleep., Disp: 30 tablet, Rfl: 0  Allergies  Allergen Reactions  . Zoloft [Sertraline Hcl] Hives   Review of Systems Objective:  There were no vitals filed for this visit.  General: Well developed, nourished, in no acute distress,  alert and oriented x3   Dermatological: Skin is warm, dry and supple bilateral. Nails x 10 are well maintained; remaining integument appears unremarkable at this time. There are no open sores, no preulcerative lesions, no rash or signs of infection present.  Vascular: Dorsalis Pedis artery and Posterior Tibial artery pedal pulses are 2/4 bilateral with immedate capillary fill time. Pedal hair growth present. No varicosities and no lower extremity edema present bilateral.   Neruologic: Grossly intact via light touch bilateral. Vibratory intact via tuning fork bilateral. Protective threshold with Semmes Wienstein monofilament intact to all pedal sites bilateral. Patellar and Achilles deep tendon reflexes 2+  bilateral. No Babinski or clonus noted bilateral.   Musculoskeletal: No gross boney pedal deformities bilateral. No pain, crepitus, or limitation noted with foot and ankle range of motion bilateral. Muscular strength 5/5 in all groups tested bilateral.  Hallux valgus deformity bilateral full range of motion.  Hammertoe deformities some of which do demonstrate some osteoarthritic changes at the PIPJ's but little deformity at the metatarsophalangeal joints.  Gait: Unassisted, Nonantalgic.    Radiographs:  Radiographs taken today demonstrate an osseously mature individual with hammertoe deformities and hallux valgus deformities.  Obvious that previous bunion repairs have been performed with narrowed first metatarsal heads and curvature of the first metatarsal itself.  Appears to be some joint space narrowing some subchondral sclerosis consistent with early arthritic findings.  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  Plan: Discussed etiology pathology conservative versus surgical therapies.  At this point we discussed him surgical correction I explained to her that either she would have to end up with a Lapidus because there is just not enough head of the metatarsal to perform a typical capital osteotomy to repair the deformity.  She is requesting minimal incision so I will refer her to Dr. Sherryle Lis for that evaluation.     Max T. [place], [place]

## 2020-08-03 ENCOUNTER — Other Ambulatory Visit: Payer: Self-pay

## 2020-08-03 ENCOUNTER — Ambulatory Visit (INDEPENDENT_AMBULATORY_CARE_PROVIDER_SITE_OTHER): Payer: No Typology Code available for payment source | Admitting: Podiatry

## 2020-08-03 DIAGNOSIS — M2011 Hallux valgus (acquired), right foot: Secondary | ICD-10-CM | POA: Diagnosis not present

## 2020-08-03 DIAGNOSIS — M2012 Hallux valgus (acquired), left foot: Secondary | ICD-10-CM | POA: Diagnosis not present

## 2020-08-03 DIAGNOSIS — M2042 Other hammer toe(s) (acquired), left foot: Secondary | ICD-10-CM

## 2020-08-03 DIAGNOSIS — M21611 Bunion of right foot: Secondary | ICD-10-CM

## 2020-08-03 DIAGNOSIS — M2041 Other hammer toe(s) (acquired), right foot: Secondary | ICD-10-CM | POA: Diagnosis not present

## 2020-08-03 DIAGNOSIS — M21612 Bunion of left foot: Secondary | ICD-10-CM

## 2020-08-03 NOTE — Patient Instructions (Signed)
Procedures we discussed: great toe joint fusion, hammertoe correction 2-4, shortening metatarsals 2-4

## 2020-08-04 ENCOUNTER — Encounter: Payer: Self-pay | Admitting: Podiatry

## 2020-08-04 NOTE — Progress Notes (Signed)
Subjective:  Patient ID: Sheila Galloway, female    DOB: 06/11/1960,  MRN: [id number]  Chief Complaint  Patient presents with  . Bunions    Surgery consult     60 y.o. female presents with the above complaint. History confirmed with patient.  She was referred to me by Dr. Milinda Pointer for surgical consultation.  She had previous bunion correction on both feet approximately 30 years ago.  She has recurrence of the bunions but the left is the worst and is the most painful.  Also pushing is the second toe which is causing it to drive upwards and all toes are contracted.  Objective:  Physical Exam: warm, good capillary refill, no trophic changes or ulcerative lesions, normal DP and PT pulses and normal sensory exam.  Bilaterally she has recurrent hallux valgus with semireducible hammertoe deformities, the second is quite rigid and the third is semirigid.  Hallux is fairly track bound only right side reducible on the left.  No pain in mid range of motion of the left side.   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   Plan:  Patient was evaluated and treated and all questions answered.  I had a long discussion with the patient regarding her recurrent hallux valgus and residual hammertoe deformities.  So far she has tried nonsurgical therapy without relief and her deformity continues to worsen and is beginning to bother her more more.  She is interested in surgical correction.  We discussed the importance of first metatarsal pronation and other rotational components of met primus varus may contribute to bunion deformity and the importance of addressing these at the initial index procedure.  I discussed with her this is probably not something that we really knew much about and had considered when she had her original surgery and it likely it was still the  appropriate procedure given what we know at the time.  She inquires if there is something she did wrong to make it worse in the postop period in which I do not think that is the case, I think the distal osteotomy likely just undercorrected her deformity.  We discussed surgical revision and what procedure would be best.  Initially we discussed minimally invasive bunionectomy, I do not think this would be a good idea for her and would leave chance of recurrence due to the deformity within the metatarsal itself secondary to surgery.  The same goes for Lapidus procedure I think we would be able to address the intermetatarsal angle as well as the rotational component but due to the angular change from the previous osteotomy I think this would make her correction difficult and put her at high chance of recurrence.  She is interested in permanent correction without recurrence of the bunion that I recommend first metatarsal phalangeal joint arthrodesis.  We discussed the rationale behind this and what sort of functional ability there is following this.  She has good range of motion of the hallux IPJ as well as the tarsometatarsal joints I think she will have minimal functional impact.  Concomitant with this I think she is at minimum we need a shortening osteotomy of second metatarsal, I think would be best to shorten the second third and fourth metatarsals with distal metatarsal metaphyseal osteotomies through a percutaneous approach.  She likely will also need a flexor tenotomy of the fourth toe, PIPJ arthrodesis of  the second and third toes with wire fixation.  We discussed the risk, benefits and potential complications of the above procedures as well as the expected postoperative course and need for periods of restricted weightbearing.  Also discussed the rationale of using autogenous calcaneal bone graft.  She likely will plan for surgery later this year and will return to see me 6 to 8 weeks prior to surgery for her  preoperative planning visit, dispensation of DME as well as signing consent.  All questions were addressed.  Return when ready to schedule surgery.

## 2020-09-07 IMAGING — MG DIGITAL SCREENING BILAT W/ TOMO W/ CAD
8 series · 8 of 24 positions shown · non-contrast
Comparison: Previous exam(s).

CLINICAL DATA: Screening.

EXAM:
DIGITAL SCREENING BILATERAL MAMMOGRAM WITH TOMO AND CAD

[R CC synth-2D]
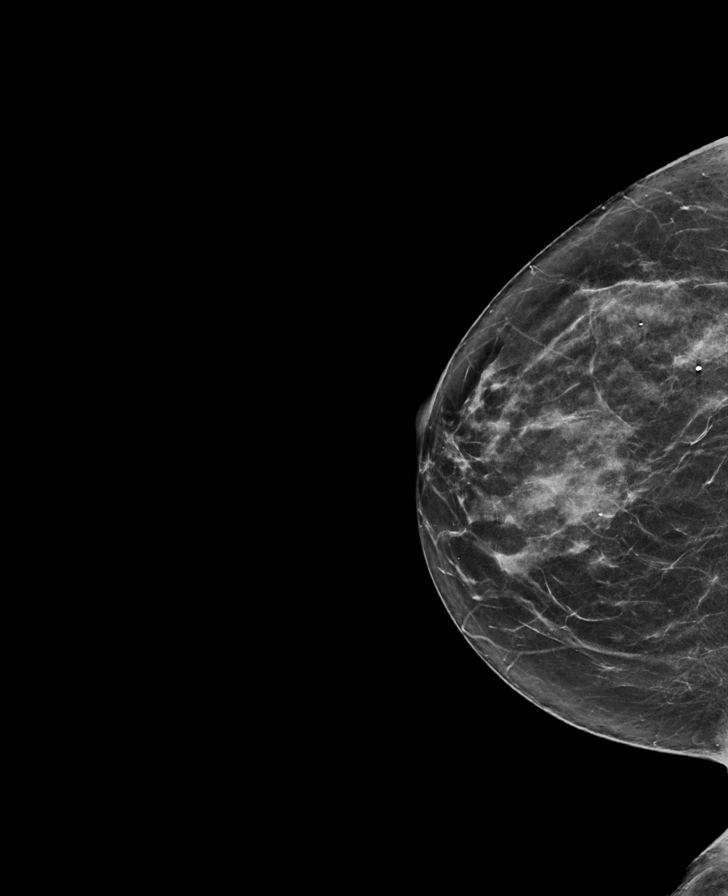

[L CC synth-2D]
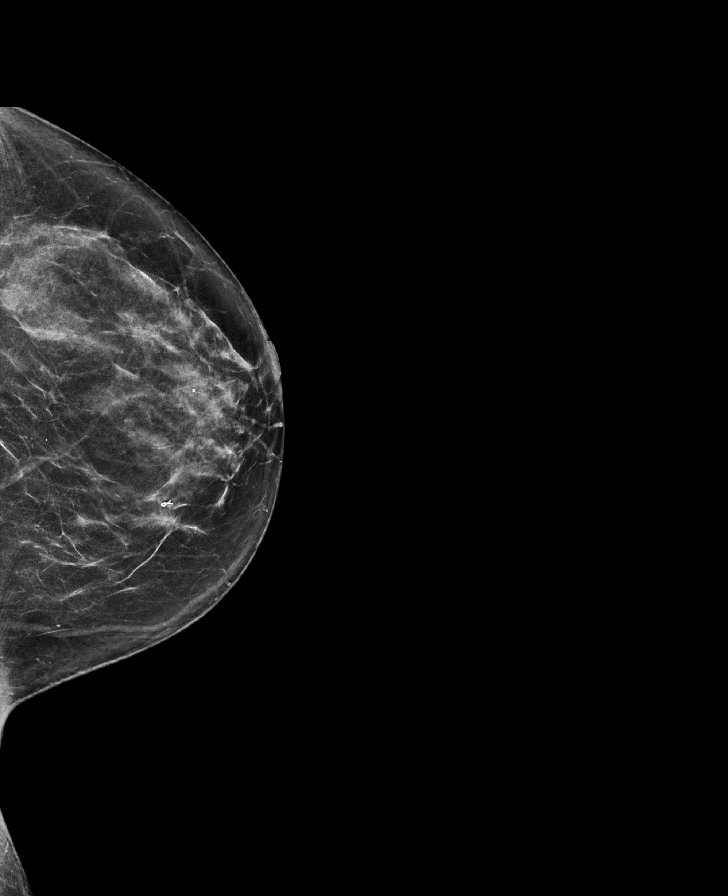

[R MLO synth-2D]
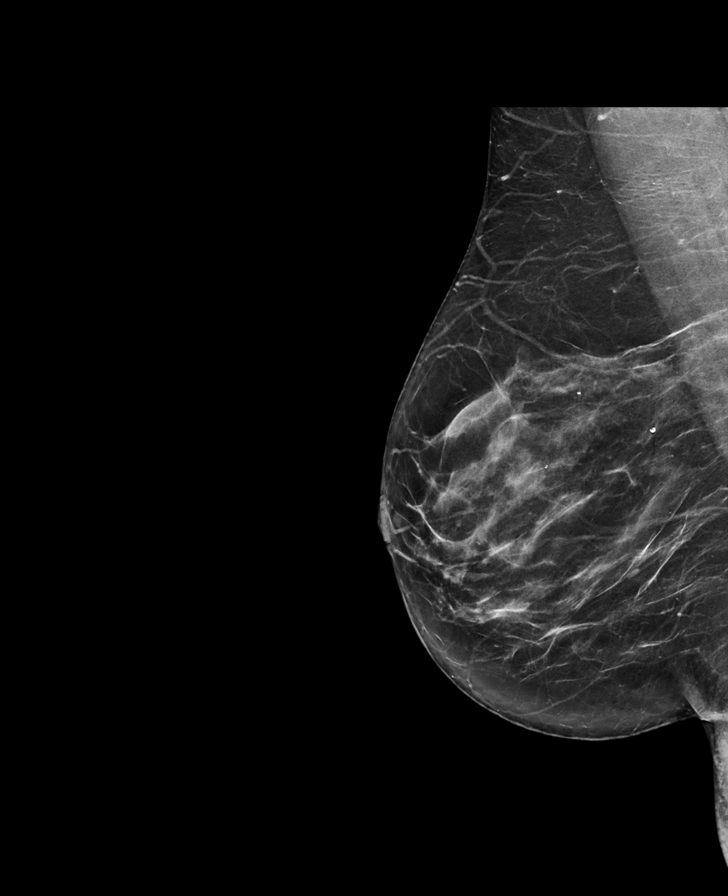

[L MLO synth-2D]
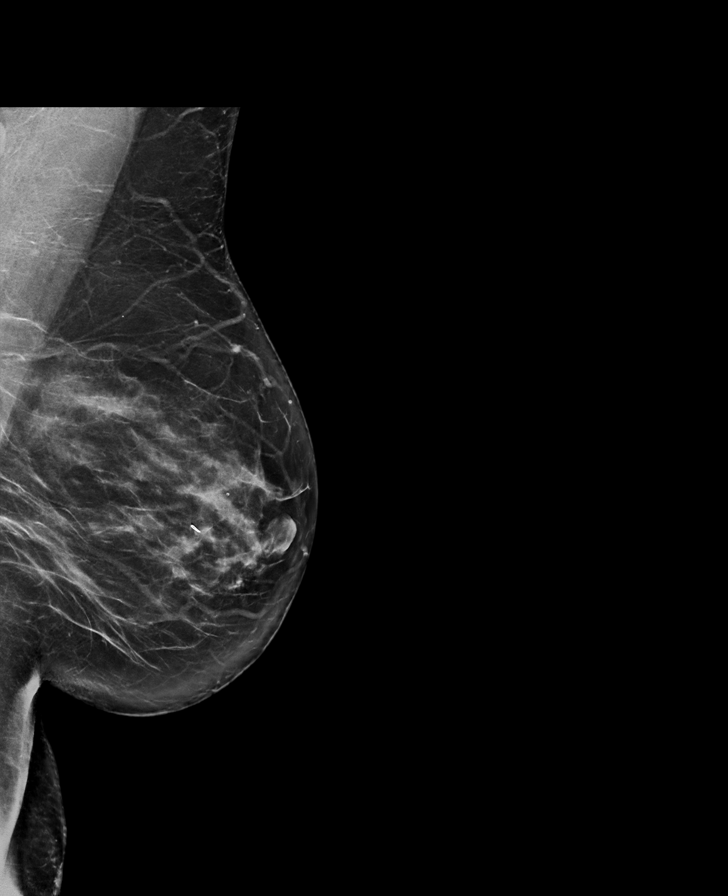

[R CC tomo · tomo slice 33/65.0]
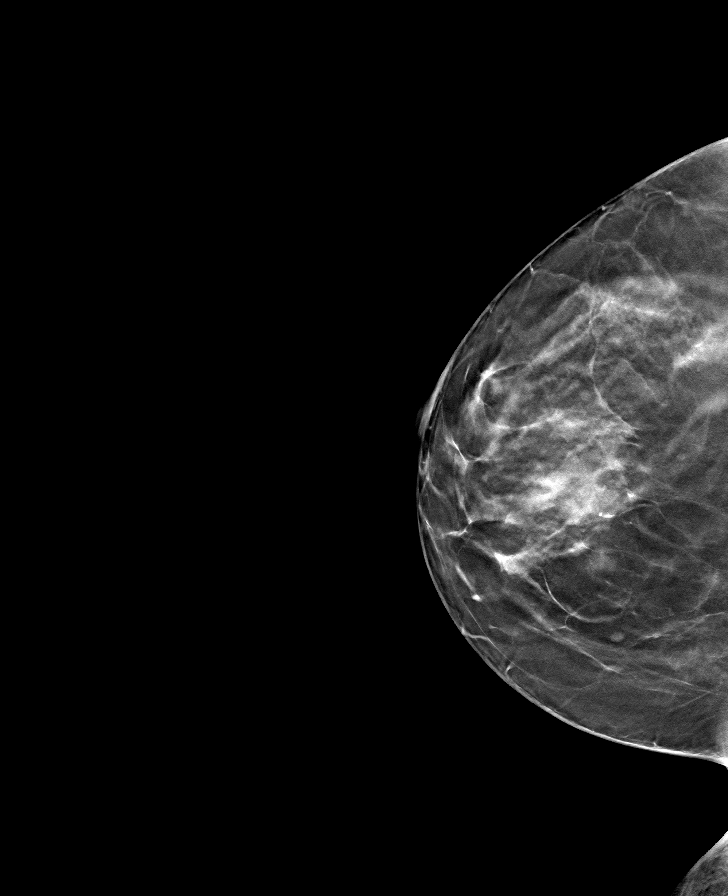

[R MLO tomo · tomo slice 36/71.0]
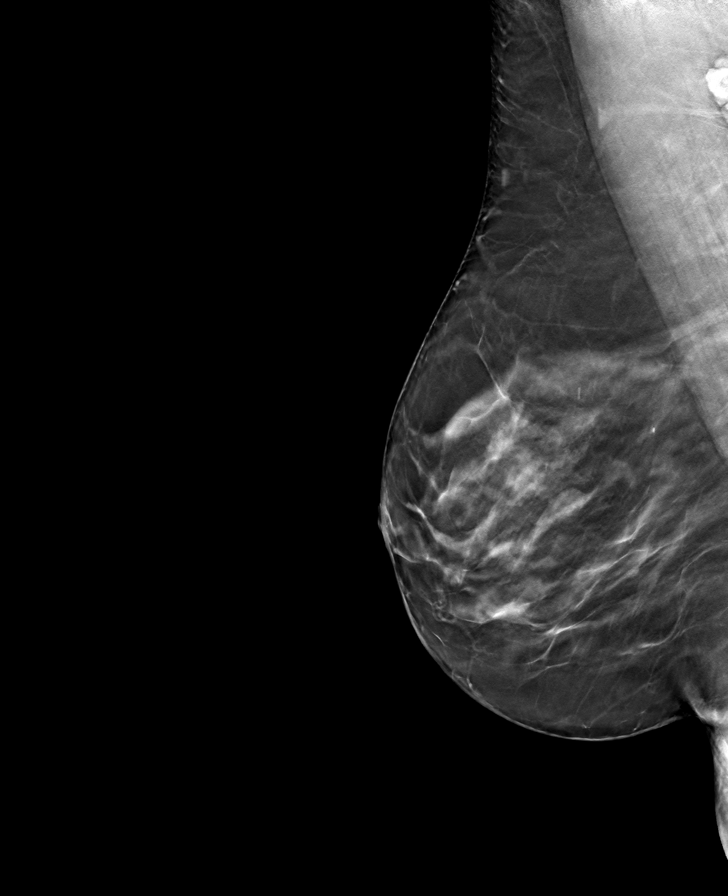

[L CC tomo · tomo slice 33/64.0]
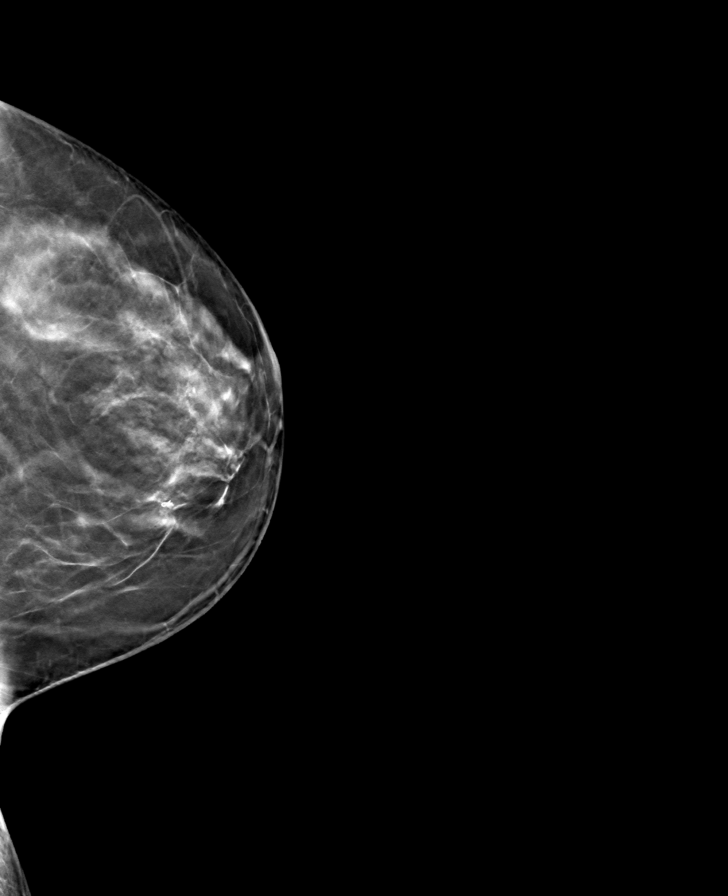

[L MLO tomo · tomo slice 39/76.0]
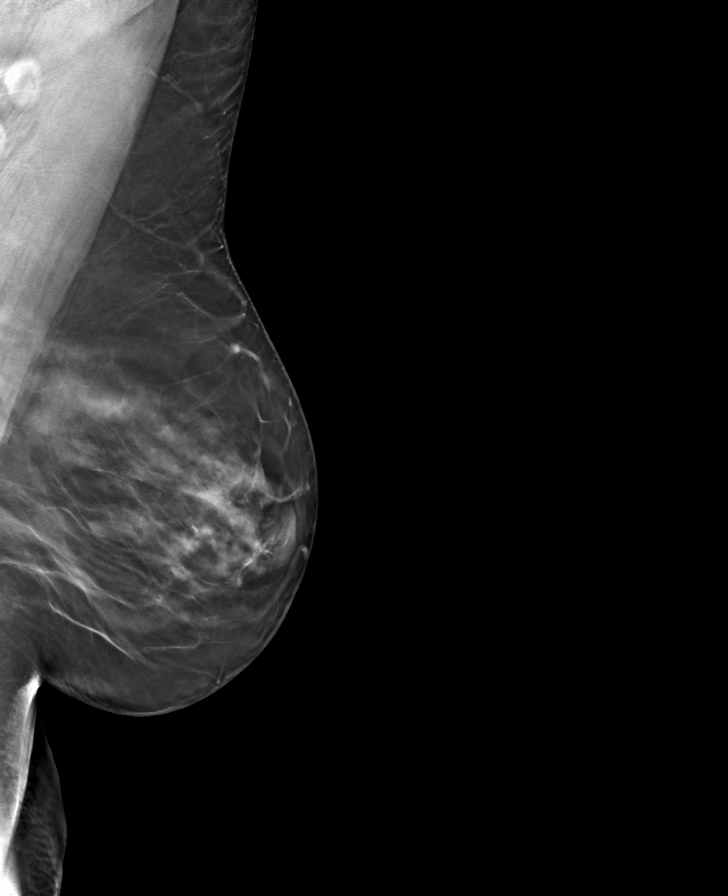

[8 of 24 positions shown; findings below may reference images not displayed]

ACR Breast Density Category c: The breast tissue is heterogeneously
dense, which may obscure small masses.
FINDINGS: There are no findings suspicious for malignancy. Images were
processed with CAD.
IMPRESSION: No mammographic evidence of malignancy. A result letter of this
screening mammogram will be mailed directly to the patient.

RECOMMENDATION:
Screening mammogram in one year. (Code:FT-U-LHB)

BI-RADS CATEGORY  1: Negative.

## 2021-04-13 ENCOUNTER — Other Ambulatory Visit: Payer: Self-pay | Admitting: Obstetrics & Gynecology

## 2021-04-13 DIAGNOSIS — Z1231 Encounter for screening mammogram for malignant neoplasm of breast: Secondary | ICD-10-CM

## 2021-05-19 ENCOUNTER — Ambulatory Visit
Admission: RE | Admit: 2021-05-19 | Discharge: 2021-05-19 | Disposition: A | Discharge status: Home / Self Care | Payer: No Typology Code available for payment source | Source: Ambulatory Visit | Attending: Obstetrics & Gynecology | Admitting: Obstetrics & Gynecology

## 2021-05-19 DIAGNOSIS — Z1231 Encounter for screening mammogram for malignant neoplasm of breast: Secondary | ICD-10-CM

## 2021-05-24 ENCOUNTER — Other Ambulatory Visit: Payer: Self-pay | Admitting: Obstetrics & Gynecology

## 2021-05-24 DIAGNOSIS — R928 Other abnormal and inconclusive findings on diagnostic imaging of breast: Secondary | ICD-10-CM

## 2021-06-16 ENCOUNTER — Ambulatory Visit: Payer: No Typology Code available for payment source

## 2021-06-16 ENCOUNTER — Other Ambulatory Visit: Payer: Self-pay | Admitting: Obstetrics & Gynecology

## 2021-06-16 ENCOUNTER — Ambulatory Visit
Admission: RE | Admit: 2021-06-16 | Discharge: 2021-06-16 | Disposition: A | Discharge status: Home / Self Care | Payer: No Typology Code available for payment source | Source: Ambulatory Visit | Attending: Obstetrics & Gynecology | Admitting: Obstetrics & Gynecology

## 2021-06-16 DIAGNOSIS — R928 Other abnormal and inconclusive findings on diagnostic imaging of breast: Secondary | ICD-10-CM

## 2021-06-29 ENCOUNTER — Other Ambulatory Visit: Payer: No Typology Code available for payment source

## 2021-09-22 ENCOUNTER — Ambulatory Visit: Payer: No Typology Code available for payment source | Admitting: Podiatry

## 2021-09-29 ENCOUNTER — Ambulatory Visit (INDEPENDENT_AMBULATORY_CARE_PROVIDER_SITE_OTHER): Payer: No Typology Code available for payment source | Admitting: Podiatry

## 2021-09-29 ENCOUNTER — Ambulatory Visit (INDEPENDENT_AMBULATORY_CARE_PROVIDER_SITE_OTHER): Payer: No Typology Code available for payment source

## 2021-09-29 DIAGNOSIS — Z01818 Encounter for other preprocedural examination: Secondary | ICD-10-CM

## 2021-09-29 DIAGNOSIS — M2042 Other hammer toe(s) (acquired), left foot: Secondary | ICD-10-CM | POA: Diagnosis not present

## 2021-09-29 DIAGNOSIS — M21612 Bunion of left foot: Secondary | ICD-10-CM | POA: Diagnosis not present

## 2021-09-29 DIAGNOSIS — M2012 Hallux valgus (acquired), left foot: Secondary | ICD-10-CM

## 2021-09-29 DIAGNOSIS — M21962 Unspecified acquired deformity of left lower leg: Secondary | ICD-10-CM

## 2021-10-03 NOTE — Progress Notes (Signed)
?  Subjective:  ?Patient ID: Sheila Galloway, female    DOB: [birth year],  MRN: [id number] ? ?Chief Complaint  ?Patient presents with  ? Consult  ?  Surgery consult ;left foot  ? ? ?61 y.o. female returns for follow-up with the above complaint. History confirmed with patient.  The left bunion is continue be bothersome feels that it is bothering her a little bit more now.  She is concerned about the best way to proceed because she does not want her foot to worsen to the point that her grandmother had severe foot deformity when she was older ? ?Objective:  ?Physical Exam: ?warm, good capillary refill, no trophic changes or ulcerative lesions, normal DP and PT pulses and normal sensory exam.  Bilaterally she has recurrent hallux valgus with semireducible hammertoe deformities, the second is quite rigid and the third is semirigid.  Hallux is fairly track bound only right side reducible on the left.  No pain in mid range of motion of the left side.  Prominence of the metatarsals plantarly ? ? ?Radiographs: ?New films taken today show continued hallux valgus with metatarsus primus varus, hammertoe deformity of the second and a moderate increase in the intermetatarsal angle there is taking of the metatarsal head with a very thin metatarsal distally.  Degenerative changes in the first MTPJ noted ?Assessment:  ? ?1. Hallux valgus with bunions, left   ?2. Hammertoe of left foot   ?3. Metatarsal deformity, left   ? ? ? ?Plan:  ?Patient was evaluated and treated and all questions answered. ? ?Today we again discussed her recurrent hallux valgus and residual hammertoe deformities.  So far she has tried nonsurgical therapy without relief and her deformity continues to worsen and is beginning to bother her more more.  She is concerned this will worsen over time.  We discussed that I believe that the best procedure to correct her bunion permanently without recurrence due to the size and position of the metatarsal head from  previous surgery would be a first metatarsal phalangeal joint arthrodesis.  We discussed the rationale behind this and what sort of functional ability there is following this.  We also discussed the limitations.  We also discussed second hammertoe correction and shortening osteotomies of the second third and fourth and fifth metatarsals.  She likely will also need a flexor tenotomy of the fourth toe, PIPJ arthrodesis of the second and third toes with wire fixation and arthroplasty of the fifth toe.  We discussed the risk, benefits and potential complications of the above procedures as well as the expected postoperative course and need for periods of restricted weightbearing.  She would like to discuss further with her husband and she will return to see me when she is ready to schedule surgery.  We also discussed the alternative a custom molded orthosis to see if this alleviates her foot pain prior to surgery.  She will consider this as well and let me know if she is ready for casting.  All questions were addressed. ? ?Return when ready to schedule surgery or when ready to cast for orthotics.  ? ?

## 2021-12-06 ENCOUNTER — Ambulatory Visit (INDEPENDENT_AMBULATORY_CARE_PROVIDER_SITE_OTHER): Payer: No Typology Code available for payment source | Admitting: Podiatry

## 2021-12-06 DIAGNOSIS — E559 Vitamin D deficiency, unspecified: Secondary | ICD-10-CM | POA: Diagnosis not present

## 2021-12-06 DIAGNOSIS — M2012 Hallux valgus (acquired), left foot: Secondary | ICD-10-CM

## 2021-12-06 DIAGNOSIS — R2689 Other abnormalities of gait and mobility: Secondary | ICD-10-CM

## 2021-12-06 DIAGNOSIS — M2042 Other hammer toe(s) (acquired), left foot: Secondary | ICD-10-CM

## 2021-12-06 DIAGNOSIS — M21962 Unspecified acquired deformity of left lower leg: Secondary | ICD-10-CM | POA: Diagnosis not present

## 2021-12-06 DIAGNOSIS — M21612 Bunion of left foot: Secondary | ICD-10-CM

## 2021-12-07 NOTE — Progress Notes (Signed)
  Subjective:  Patient ID: Sheila Galloway, female    DOB: 15-Aug-1960,  MRN: [id number]  Chief Complaint  Patient presents with   Bunions    Surgery consult    61 y.o. female returns for follow-up with the above complaint. History confirmed with patient.  Her husband is here with her today.  She is ready to schedule surgery on the left foot that has been very painful  Objective:  Physical Exam: warm, good capillary refill, no trophic changes or ulcerative lesions, normal DP and PT pulses and normal sensory exam.  Bilaterally she has recurrent hallux valgus with semireducible hammertoe deformities, the second is quite rigid and the third is semirigid.  Hallux is fairly track bound only right side reducible on the left.  No pain in mid range of motion of the left side.  Prominence of the metatarsals plantarly   Radiographs: New films taken today show continued hallux valgus with metatarsus primus varus, hammertoe deformity of the second and a moderate increase in the intermetatarsal angle there is taking of the metatarsal head with a very thin metatarsal distally.  Degenerative changes in the first MTPJ noted Assessment:   1. Vitamin D deficiency      Plan:  Patient was evaluated and treated and all questions answered.  Today we again discussed her recurrent hallux valgus and residual hammertoe deformities.   We discussed that I believe that the best procedure to correct her bunion permanently without recurrence due to the size and position of the metatarsal head from previous surgery would be a first metatarsal phalangeal joint arthrodesis.  We discussed the rationale behind this due to the abnormal shape of the metatarsal due to prior surgery and what sort of functional ability there is following this.  We also discussed the limitations.  We also discussed second third fourth and fifth hammertoe correction and shortening osteotomies of the second third and fourth and fifth  metatarsals.  She likely will also need a flexor tenotomy of the fourth toe, PIPJ arthrodesis of the second and third toes with wire fixation and arthroplasty of the fifth toe.  We discussed the risk, benefits and potential complications of the above procedures as well as the expected postoperative course and need for periods of restricted weightbearing.  Vitamin D level ordered prior to surgery   Surgical plan:  Procedure: -Left foot first metatarsophalangeal joint arthrodesis, possible bone graft from heel, hammertoe correction with PIPJ arthrodesis 2 and 3, flexor tenotomy of the fourth, arthroplasty fifth, metatarsal shortening osteotomy second third fourth and fifth metatarsals  Location: -[facility]  Anesthesia plan: -IV sedation with a local block  Postoperative pain plan: - Tylenol 1000 mg every 6 hours, ibuprofen 600 mg every 8 hours, gabapentin 300 mg every 8 hours x5 days, oxycodone 5 mg 1-2 tabs every 6 hours only as needed  DVT prophylaxis: -ASA 325 mg twice daily  WB Restrictions / DME needs: -NWB in splint postop, will transition to boot after postop visit

## 2021-12-20 LAB — VITAMIN D 25 HYDROXY (VIT D DEFICIENCY, FRACTURES): Vit D, 25-Hydroxy: 27.4 ng/mL — ABNORMAL LOW (ref 30.0–100.0)

## 2021-12-20 LAB — CALCIUM: Calcium: 9.8 mg/dL (ref 8.7–10.2)

## 2022-01-03 ENCOUNTER — Encounter: Payer: Self-pay | Admitting: Podiatry

## 2022-01-03 MED ORDER — VITAMIN D (ERGOCALCIFEROL) 1.25 MG (50000 UNIT) PO CAPS: 50000.0000 [IU] | ORAL_CAPSULE | ORAL | 0 refills | Status: DC

## 2022-01-24 ENCOUNTER — Other Ambulatory Visit: Payer: Self-pay | Admitting: Podiatry

## 2022-02-14 ENCOUNTER — Other Ambulatory Visit: Payer: Self-pay | Admitting: Podiatry

## 2022-02-22 ENCOUNTER — Telehealth: Payer: Self-pay | Admitting: Urology

## 2022-02-22 NOTE — Telephone Encounter (Signed)
DOS - 03/24/22  METATARSAL OSTEOTOMY 2-5 LEFT --- 81594 HAMMERTOE REPAIR 2-5 LEFT --- 70761 HALLUX MPJ FUSION LEFT --- 51834 BONE BIOPSY LEFT --- 20900  [employer] EFFECTIVE DATE - 05/22/21  PER [employer] FOR CPT CODES 37357, 89784, 78412 AND 82081 NO PRIOR AUTH IS REQUIRED.  REF # [id number]

## 2022-03-24 ENCOUNTER — Other Ambulatory Visit: Payer: Self-pay | Admitting: Podiatry

## 2022-03-24 DIAGNOSIS — M2012 Hallux valgus (acquired), left foot: Secondary | ICD-10-CM | POA: Diagnosis not present

## 2022-03-24 DIAGNOSIS — M7742 Metatarsalgia, left foot: Secondary | ICD-10-CM | POA: Diagnosis not present

## 2022-03-24 DIAGNOSIS — M24575 Contracture, left foot: Secondary | ICD-10-CM | POA: Diagnosis not present

## 2022-03-24 DIAGNOSIS — M2042 Other hammer toe(s) (acquired), left foot: Secondary | ICD-10-CM | POA: Diagnosis not present

## 2022-03-24 MED ORDER — IBUPROFEN 600 MG PO TABS: 600.0000 mg | ORAL_TABLET | Freq: Three times a day (TID) | ORAL | 0 refills | Status: AC | PRN

## 2022-03-24 MED ORDER — OXYCODONE HCL 5 MG PO TABS: 5.0000 mg | ORAL_TABLET | ORAL | 0 refills | Status: AC | PRN

## 2022-03-24 MED ORDER — ACETAMINOPHEN 500 MG PO TABS: 1000.0000 mg | ORAL_TABLET | Freq: Four times a day (QID) | ORAL | 0 refills | Status: AC | PRN

## 2022-03-24 MED ORDER — ASPIRIN 325 MG PO TBEC: 325.0000 mg | DELAYED_RELEASE_TABLET | Freq: Two times a day (BID) | ORAL | 0 refills | Status: DC

## 2022-03-24 MED ORDER — GABAPENTIN 300 MG PO CAPS: 300.0000 mg | ORAL_CAPSULE | Freq: Three times a day (TID) | ORAL | 0 refills | Status: DC

## 2022-03-24 NOTE — Progress Notes (Signed)
11/3 lesser hammertoe corrections, metatarsal osteotomies, 1st MPJ fusion

## 2022-03-30 ENCOUNTER — Encounter: Payer: Self-pay | Admitting: Podiatry

## 2022-03-30 ENCOUNTER — Ambulatory Visit (INDEPENDENT_AMBULATORY_CARE_PROVIDER_SITE_OTHER): Payer: No Typology Code available for payment source | Admitting: Podiatry

## 2022-03-30 ENCOUNTER — Telehealth: Payer: Self-pay | Admitting: *Deleted

## 2022-03-30 DIAGNOSIS — M21612 Bunion of left foot: Secondary | ICD-10-CM

## 2022-03-30 DIAGNOSIS — M2042 Other hammer toe(s) (acquired), left foot: Secondary | ICD-10-CM | POA: Diagnosis not present

## 2022-03-30 DIAGNOSIS — M21962 Unspecified acquired deformity of left lower leg: Secondary | ICD-10-CM

## 2022-03-30 DIAGNOSIS — M2012 Hallux valgus (acquired), left foot: Secondary | ICD-10-CM

## 2022-03-30 NOTE — Progress Notes (Signed)
  Subjective:  Patient ID: Sheila Galloway, female    DOB: 06/18/1960,  MRN: [id number]  Chief Complaint  Patient presents with   Routine Post Op    POV #1 DOS 03/24/2022 FUSIONOF LT GREAT TOE, HAMMERTOE CORRECTION 2-5, METATARSAL BONE CUTS 2-5, BONE GRAFT FROM HEEL    61 y.o. female returns for post-op check.  Overall doing well having some pain but is improving consistently  Review of Systems: Negative except as noted in the HPI. Denies N/V/F/Ch.   Objective:  There were no vitals filed for this visit. There is no height or weight on file to calculate BMI. Constitutional Well developed. Well nourished.  Vascular Foot warm and well perfused. Capillary refill normal to all digits.  Calf is soft and supple, no posterior calf or knee pain, negative Homans' sign  Neurologic Normal speech. Oriented to person, place, and time. Epicritic sensation to light touch grossly present bilaterally.  Dermatologic Skin healing well without signs of infection. Skin edges well coapted without signs of infection.  Orthopedic: Tenderness to palpation noted about the surgical site.  Moderate edema   Multiple view plain film radiographs: Hardware intact and in good position and equivalent to immediate postoperative films with correction maintained Assessment:   1. Hallux valgus with bunions, left   2. Hammertoe of left foot   3. Metatarsal deformity, left    Plan:  Patient was evaluated and treated and all questions answered.  S/p foot surgery left -Progressing as expected post-operatively. -XR: Noted above -WB Status: NWB in short cam walker boot this was dispensed today.  May be removed when resting but must be on when up and moving around -Sutures: She will return in 2 weeks for suture removal.  May begin bathing after that. -Medications: No refills required -Foot redressed.  No follow-ups on file.

## 2022-03-30 NOTE — Telephone Encounter (Signed)
Patient is calling to make the physician aware that she lost her balance,put weight on surgery foot on pinky side w/ boot on,no pain,shifting. I suggested that she continue to elevate, icing and will let the physician know, he will call back with other recommendations.

## 2022-03-31 NOTE — Telephone Encounter (Signed)
Spoke with patient and she has not had any new changes,will call back if foot worsens.

## 2022-04-11 ENCOUNTER — Ambulatory Visit (INDEPENDENT_AMBULATORY_CARE_PROVIDER_SITE_OTHER): Payer: No Typology Code available for payment source | Admitting: Podiatry

## 2022-04-11 ENCOUNTER — Encounter: Payer: Self-pay | Admitting: Podiatry

## 2022-04-11 DIAGNOSIS — M21962 Unspecified acquired deformity of left lower leg: Secondary | ICD-10-CM

## 2022-04-11 DIAGNOSIS — M2042 Other hammer toe(s) (acquired), left foot: Secondary | ICD-10-CM

## 2022-04-11 DIAGNOSIS — M2012 Hallux valgus (acquired), left foot: Secondary | ICD-10-CM

## 2022-04-11 DIAGNOSIS — M21612 Bunion of left foot: Secondary | ICD-10-CM

## 2022-04-11 NOTE — Progress Notes (Signed)
  Subjective:  Patient ID: Sheila Galloway, female    DOB: 1961-03-14,  MRN: [id number]  Chief Complaint  Patient presents with   Routine Post Op    POV #2 DOS 03/24/2022 FUSIONOF LT GREAT TOE, HAMMERTOE CORRECTION 2-5, METATARSAL BONE CUTS 2-5, BONE GRAFT FROM HEEL    61 y.o. female returns for post-op check.  She continues to do well  Review of Systems: Negative except as noted in the HPI. Denies N/V/F/Ch.   Objective:  There were no vitals filed for this visit. There is no height or weight on file to calculate BMI. Constitutional Well developed. Well nourished.  Vascular Foot warm and well perfused. Capillary refill normal to all digits.  Calf is soft and supple, no posterior calf or knee pain, negative Homans' sign  Neurologic Normal speech. Oriented to person, place, and time. Epicritic sensation to light touch grossly present bilaterally.  Dermatologic Incisions seem to be well-healed nonhypertrophic no signs of infection anywhere  Orthopedic: Minimal tenderness to palpation noted about the surgical site.  Moderate edema still present   Multiple view plain film radiographs: Hardware intact and in good position and equivalent to immediate postoperative films with correction maintained Assessment:   1. Hallux valgus with bunions, left   2. Hammertoe of left foot   3. Metatarsal deformity, left    Plan:  Patient was evaluated and treated and all questions answered.  S/p foot surgery left -Progressing as expected post-operatively. -XR: Noted above -WB Status: She may begin gradual protected weightbearing in the short cam boot -Sutures: All sutures removed today.  She may begin bathing with application of Neosporin around pin sites-Medications: No refills required -Continue using Ace wrap -We discussed the position of the fourth toe which is in slight extensus.  May consider in office percutaneous extensor tenotomy if it maintains this position  Return in about 3  weeks (around 05/02/2022) for post op (new x-rays).

## 2022-04-15 ENCOUNTER — Other Ambulatory Visit: Payer: Self-pay | Admitting: Podiatry

## 2022-04-16 ENCOUNTER — Other Ambulatory Visit: Payer: Self-pay | Admitting: Podiatry

## 2022-04-17 ENCOUNTER — Telehealth: Payer: Self-pay | Admitting: *Deleted

## 2022-04-17 NOTE — Telephone Encounter (Signed)
Patient is calling having numbness on chin of the post surgery foot,finished out the gabapentin, still taking aspirin, tylenol ibuprofen,  Explained to patient to ice behind the knee, loosen up the ace wrap, keep elevating . She verbalized understanding/. Please advise for any other recommendations.

## 2022-04-26 ENCOUNTER — Other Ambulatory Visit: Payer: Self-pay | Admitting: Obstetrics & Gynecology

## 2022-04-26 DIAGNOSIS — Z1231 Encounter for screening mammogram for malignant neoplasm of breast: Secondary | ICD-10-CM

## 2022-05-02 ENCOUNTER — Ambulatory Visit (INDEPENDENT_AMBULATORY_CARE_PROVIDER_SITE_OTHER): Payer: No Typology Code available for payment source

## 2022-05-02 ENCOUNTER — Ambulatory Visit (INDEPENDENT_AMBULATORY_CARE_PROVIDER_SITE_OTHER): Payer: No Typology Code available for payment source | Admitting: Podiatry

## 2022-05-02 DIAGNOSIS — M2012 Hallux valgus (acquired), left foot: Secondary | ICD-10-CM | POA: Diagnosis not present

## 2022-05-02 DIAGNOSIS — M2042 Other hammer toe(s) (acquired), left foot: Secondary | ICD-10-CM

## 2022-05-02 DIAGNOSIS — M21612 Bunion of left foot: Secondary | ICD-10-CM | POA: Diagnosis not present

## 2022-05-02 DIAGNOSIS — M21962 Unspecified acquired deformity of left lower leg: Secondary | ICD-10-CM

## 2022-05-04 NOTE — Progress Notes (Signed)
  Subjective:  Patient ID: Sheila Galloway, female    DOB: 03/14/1961,  MRN: [id number]  Chief Complaint  Patient presents with   Routine Post Op    XRAYS POV #3 DOS 03/24/2022 FUSIONOF LT GREAT TOE, HAMMERTOE CORRECTION 2-5, METATARSAL BONE CUTS 2-5, BONE GRAFT FROM HEEL    61 y.o. female returns for post-op check.  She continues to do well  Review of Systems: Negative except as noted in the HPI. Denies N/V/F/Ch.   Objective:  There were no vitals filed for this visit. There is no height or weight on file to calculate BMI. Constitutional Well developed. Well nourished.  Vascular Foot warm and well perfused. Capillary refill normal to all digits.  Calf is soft and supple, no posterior calf or knee pain, negative Homans' sign  Neurologic Normal speech. Oriented to person, place, and time. Epicritic sensation to light touch grossly present bilaterally.  Dermatologic Incisions are well-healed not hypertrophic  Orthopedic: Minimal tenderness to palpation noted about the surgical site.  Mild edema still present   Multiple view plain film radiographs: Hardware intact and in good position and there is good early consolidation across the fusion sites Assessment:   1. Hallux valgus with bunions, left   2. Hammertoe of left foot   3. Metatarsal deformity, left    Plan:  Patient was evaluated and treated and all questions answered.  S/p foot surgery left -Progressing as expected post-operatively. -XR: Noted above, good healing noted -WB Status: She may wear the boot for another 2 weeks and then transition to a regular supportive shoe -Kirschner wires removed uneventfully-Continue using Ace wrap    Return in about 6 weeks (around 06/13/2022) for post op (new x-rays).

## 2022-05-10 ENCOUNTER — Other Ambulatory Visit: Payer: Self-pay | Admitting: Podiatry

## 2022-06-13 ENCOUNTER — Ambulatory Visit (INDEPENDENT_AMBULATORY_CARE_PROVIDER_SITE_OTHER): Payer: No Typology Code available for payment source | Admitting: Podiatry

## 2022-06-13 ENCOUNTER — Ambulatory Visit (INDEPENDENT_AMBULATORY_CARE_PROVIDER_SITE_OTHER): Payer: No Typology Code available for payment source

## 2022-06-13 DIAGNOSIS — M21962 Unspecified acquired deformity of left lower leg: Secondary | ICD-10-CM

## 2022-06-13 DIAGNOSIS — M2012 Hallux valgus (acquired), left foot: Secondary | ICD-10-CM

## 2022-06-13 DIAGNOSIS — M2042 Other hammer toe(s) (acquired), left foot: Secondary | ICD-10-CM

## 2022-06-13 DIAGNOSIS — M21612 Bunion of left foot: Secondary | ICD-10-CM

## 2022-06-13 NOTE — Progress Notes (Signed)
  Subjective:  Patient ID: Sheila Galloway, female    DOB: 1961-03-10,  MRN: [id number]  Chief Complaint  Patient presents with   Routine Post Op    POV #4 DOS 03/24/2022 FUSIONOF LT GREAT TOE, HAMMERTOE CORRECTION 2-5, METATARSAL BONE CUTS 2-5, BONE GRAFT FROM HEEL , pt states there is still some edema       62 y.o. female returns for post-op check.  She continues to do well, the fourth toe is rubbing some  Review of Systems: Negative except as noted in the HPI. Denies N/V/F/Ch.   Objective:  There were no vitals filed for this visit. There is no height or weight on file to calculate BMI. Constitutional Well developed. Well nourished.  Vascular Foot warm and well perfused. Capillary refill normal to all digits.  Calf is soft and supple, no posterior calf or knee pain, negative Homans' sign  Neurologic Normal speech. Oriented to person, place, and time. Epicritic sensation to light touch grossly present bilaterally.  Dermatologic Incisions are well-healed not hypertrophic  Orthopedic: She has no tenderness to palpation noted about the surgical site.  Mild edema still present.  Fourth toe dorsally contracted   Multiple view plain film radiographs: Hardware intact and in good position, complete consolidation across MPJ fusion site, increasing consolidation across osteotomy sites Assessment:   1. Hallux valgus with bunions, left   2. Hammertoe of left foot   3. Metatarsal deformity, left    Plan:  Patient was evaluated and treated and all questions answered.  S/p foot surgery left -Overall doing very well.  I have no further physical restrictions for her she may continue regular shoe gear and activity.  She has some continued consolidation across the osteotomy sites that I expect will progress.  We discussed the position of the fourth toe and further treatment for this.  May benefit from a extensor tenotomy if not improving or worsening.  We will continue to monitor this  and reevaluate as she increases her activity level and regular shoe gear.    Return in about 3 months (around 09/12/2022) for follow up from left foot surgery (new xrays).

## 2022-06-15 ENCOUNTER — Other Ambulatory Visit: Payer: Self-pay | Admitting: Podiatry

## 2022-06-21 ENCOUNTER — Ambulatory Visit
Admission: RE | Admit: 2022-06-21 | Discharge: 2022-06-21 | Disposition: A | Discharge status: Home / Self Care | Payer: No Typology Code available for payment source | Source: Ambulatory Visit | Attending: Obstetrics & Gynecology | Admitting: Obstetrics & Gynecology

## 2022-06-21 DIAGNOSIS — Z1231 Encounter for screening mammogram for malignant neoplasm of breast: Secondary | ICD-10-CM

## 2022-07-27 ENCOUNTER — Telehealth: Payer: Self-pay

## 2022-07-27 NOTE — Telephone Encounter (Signed)
[name] called about having the tenotomy procedure done on her 4th toe, left foot. Will this be an office surgery?

## 2022-08-03 ENCOUNTER — Ambulatory Visit (INDEPENDENT_AMBULATORY_CARE_PROVIDER_SITE_OTHER): Payer: No Typology Code available for payment source | Admitting: Podiatry

## 2022-08-03 DIAGNOSIS — M205X9 Other deformities of toe(s) (acquired), unspecified foot: Secondary | ICD-10-CM

## 2022-08-03 DIAGNOSIS — M205X2 Other deformities of toe(s) (acquired), left foot: Secondary | ICD-10-CM | POA: Diagnosis not present

## 2022-08-07 NOTE — Progress Notes (Signed)
  Subjective:  Patient ID: Sheila Galloway, female    DOB: Nov 03, 1960,  MRN: [id number]  Chief Complaint  Patient presents with   Bunions    Hallux valgus with bunions, left.   Hammer Toe    62 y.o. female returns for post-op check.  Other than the fourth toe of the foot is doing well.  The fourth toe is still irritating and contracted dorsally  Review of Systems: Negative except as noted in the HPI. Denies N/V/F/Ch.   Objective:  There were no vitals filed for this visit. There is no height or weight on file to calculate BMI. Constitutional Well developed. Well nourished.  Vascular Foot warm and well perfused. Capillary refill normal to all digits.  Calf is soft and supple, no posterior calf or knee pain, negative Homans' sign  Neurologic Normal speech. Oriented to person, place, and time. Epicritic sensation to light touch grossly present bilaterally.  Dermatologic Incisions are well-healed not hypertrophic  Orthopedic: She has no tenderness to palpation noted about the surgical site.  She has no edema  present.  Fourth toe dorsally contracted   Multiple view plain film radiographs: Hardware intact and in good position, complete consolidation across MPJ fusion site, increasing consolidation across osteotomy sites Assessment:   1. Contracture of toe joint    Plan:  Patient was evaluated and treated and all questions answered.  S/p foot surgery left -Other than the fourth toe she is doing well.  We discussed further treatment for this toe contracture and surgical and nonsurgical treatment options.  So far has not had much relief with nonsurgical treatment.  She would like to proceed with surgical correction.  We discussed the risk benefits and potential complications of the procedure.  Following consent and local field block, a #11 blade was utilized to create an extensor tenotomy of the long and short extensor tendons.  This reduce the contracture.  It was splinted in  plantarflexion with a Steri-Strip.  Post care instructions were given.  I will see her back as scheduled in April for follow-up.   No follow-ups on file.

## 2022-09-07 ENCOUNTER — Ambulatory Visit (INDEPENDENT_AMBULATORY_CARE_PROVIDER_SITE_OTHER): Payer: No Typology Code available for payment source | Admitting: Podiatry

## 2022-09-07 DIAGNOSIS — M205X9 Other deformities of toe(s) (acquired), unspecified foot: Secondary | ICD-10-CM

## 2022-09-07 NOTE — Progress Notes (Signed)
  Subjective:  Patient ID: Sheila Galloway, female    DOB: 12-17-1960,  MRN: [id number]  Chief Complaint  Patient presents with   Routine Post Op    POV #1 tenotomy    DOS: 08/03/2022 Procedure: Left fourth toe extensor tenotomy  62 y.o. female returns for post-op check.  He is doing well she not have any pain she utilize the brace as directed and the skin healed well  Review of Systems: Negative except as noted in the HPI. Denies N/V/F/Ch.   Objective:  There were no vitals filed for this visit. There is no height or weight on file to calculate BMI. Constitutional Well developed. Well nourished.  Vascular Foot warm and well perfused. Capillary refill normal to all digits.  Calf is soft and supple, no posterior calf or knee pain, negative Homans' sign  Neurologic Normal speech. Oriented to person, place, and time. Epicritic sensation to light touch grossly present bilaterally.  Dermatologic Incision well-healed not hypertrophic  Orthopedic: There is no pain to palpation.  Minimal edema.  Improved toe position    Assessment:   1. Contracture of toe joint    Plan:  Patient was evaluated and treated and all questions answered.  S/p foot surgery left -Progressing as expected post-operatively. -Overall doing well toe is much improved in position, now in line with 3 and 5.  She may continue regular shoe gear and activity as tolerated.  No further issues with the other digits or fusion site.  Return as needed at this point  Return if symptoms worsen or fail to improve.

## 2022-09-12 ENCOUNTER — Ambulatory Visit: Payer: No Typology Code available for payment source | Admitting: Podiatry

## 2023-06-04 ENCOUNTER — Other Ambulatory Visit: Payer: Self-pay | Admitting: Obstetrics & Gynecology

## 2023-06-04 DIAGNOSIS — Z1231 Encounter for screening mammogram for malignant neoplasm of breast: Secondary | ICD-10-CM

## 2023-06-26 ENCOUNTER — Ambulatory Visit
Admission: RE | Admit: 2023-06-26 | Discharge: 2023-06-26 | Disposition: A | Discharge status: Home / Self Care | Payer: No Typology Code available for payment source | Source: Ambulatory Visit | Attending: Obstetrics & Gynecology | Admitting: Obstetrics & Gynecology

## 2023-06-26 DIAGNOSIS — Z1231 Encounter for screening mammogram for malignant neoplasm of breast: Secondary | ICD-10-CM

## 2023-08-21 ENCOUNTER — Encounter: Payer: Self-pay | Admitting: Podiatry

## 2023-08-21 ENCOUNTER — Ambulatory Visit (INDEPENDENT_AMBULATORY_CARE_PROVIDER_SITE_OTHER)

## 2023-08-21 ENCOUNTER — Ambulatory Visit (INDEPENDENT_AMBULATORY_CARE_PROVIDER_SITE_OTHER): Admitting: Podiatry

## 2023-08-21 DIAGNOSIS — M79672 Pain in left foot: Secondary | ICD-10-CM

## 2023-08-21 DIAGNOSIS — S92512P Displaced fracture of proximal phalanx of left lesser toe(s), subsequent encounter for fracture with malunion: Secondary | ICD-10-CM

## 2023-08-21 DIAGNOSIS — S92412P Displaced fracture of proximal phalanx of left great toe, subsequent encounter for fracture with malunion: Secondary | ICD-10-CM

## 2023-08-21 NOTE — Patient Instructions (Signed)
Call [facility] to schedule your CT at the below locations.  Please allow at least 1 business day after your visit to process the referral.  It may take longer depending on approval from insurance.  Please let me know if you have issues or problems scheduling the CT   [facility] [phone] [address] [place],  [address]  [facility] [place] [phone] [address] [place],  [address]  

## 2023-08-24 ENCOUNTER — Ambulatory Visit
Admission: RE | Admit: 2023-08-24 | Discharge: 2023-08-24 | Disposition: A | Discharge status: Home / Self Care | Source: Ambulatory Visit | Attending: Podiatry | Admitting: Podiatry

## 2023-08-24 DIAGNOSIS — S92512P Displaced fracture of proximal phalanx of left lesser toe(s), subsequent encounter for fracture with malunion: Secondary | ICD-10-CM

## 2023-08-25 NOTE — Progress Notes (Signed)
 Subjective:  Patient ID: Sheila Galloway, female    DOB: 04-Jan-1961,  MRN: [id number]  Chief Complaint  Patient presents with   Toe Pain    Patient states that she previously had surgery a year ago and would like to know how could she get her left hallux more straight     Discussed the use of AI scribe software for clinical note transcription with the patient, who gave verbal consent to proceed.  History of Present Illness Sheila Galloway "Derderian" is a 63 year old female who presents with concerns about a previous foot injury and malunited fracture following previous MTP arthrodesis last year.   In October 2024, she tripped over a step ladder, resulting in a hard impact to her foot. She experienced extreme pain and sought care at an orthopedic walk-in clinic. An x-ray was performed, and she was informed that there was fluid, but no fracture was identified initially.    She feels that the toe may be bending more than before and is concerned that she should have sought care sooner. She notes that the fracture occurred right where the screw is located, and it acted like a lever, causing the bone to snap. She has not experienced other pains since the initial injury, which she finds unusual. She recalls having a boot initially but was advised that it was not necessary.  She recalls that the x-ray taken at the walk-in clinic may not have shown the fracture, possibly due to it being a stress fracture at the time, which can be difficult to detect initially. The pain subsided, which is why she did not follow up sooner.  She also mentions hitting her knee during the incident, which resulted in bursitis and a bruise, but these resolved without significant pain after the initial swelling subsided.      Objective:    Physical Exam VASCULAR: DP and PT pulse palpable. Foot is warm and well-perfused. Capillary fill time is brisk. DERMATOLOGIC: Normal skin turgor, texture, and  temperature. No open lesions, rashes, or ulcerations. NEUROLOGIC: Normal sensation to light touch and pressure. No paresthesias. ORTHOPEDIC: Some tenderness in hallux. Distal hallux in abducted and valgus position. Mild edema of the great toe.   No images are attached to the encounter.    Results RADIOLOGY Left foot x-ray: Malunited fracture of the distal portion of the proximal phalanx in abduction. No hardware complications or hardware failure.    Assessment:   1. Closed displaced fracture of proximal phalanx of left great toe with malunion, subsequent encounter      Plan:  Patient was evaluated and treated and all questions answered.  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 This is just distal to the MTP plate and screws and has likely healed in a malunion, as evidenced by the abnormal positioning of the toe. She reports no significant pain since the initial injury, which is unusual given the nature of the fracture. It is suspected that the fracture may have initially been a stress fracture that completed over time. The current x-ray shows a malunited fracture with no hardware complications. A CT scan is necessary to assess the healing and alignment of the fracture and to determine if surgical intervention is required, I expect it will to correct the deformity. If surgery is needed, it would involve  ORIF vs IPJ arthrodesis - Order CT scan of the left foot to assess the healing and alignment of the fracture. - Regular shoe gear and activity OK - Schedule follow-up appointment two weeks after the CT scan to discuss results and potential surgical options if necessary.        No follow-ups on file.

## 2023-09-13 ENCOUNTER — Encounter: Payer: Self-pay | Admitting: Podiatry

## 2023-09-13 ENCOUNTER — Ambulatory Visit (INDEPENDENT_AMBULATORY_CARE_PROVIDER_SITE_OTHER): Admitting: Podiatry

## 2023-09-13 VITALS — Ht 62.0 in | Wt 166.7 lb

## 2023-09-13 DIAGNOSIS — M2042 Other hammer toe(s) (acquired), left foot: Secondary | ICD-10-CM

## 2023-09-13 DIAGNOSIS — S92412P Displaced fracture of proximal phalanx of left great toe, subsequent encounter for fracture with malunion: Secondary | ICD-10-CM

## 2023-09-13 DIAGNOSIS — E559 Vitamin D deficiency, unspecified: Secondary | ICD-10-CM | POA: Diagnosis not present

## 2023-09-13 NOTE — Progress Notes (Signed)
 Subjective:  Patient ID: Sheila Galloway, female    DOB: 07/12/1960,  MRN: [id number]  Chief Complaint  Patient presents with   Foot Pain    Pt is here to discuss surgery options due to left toe.     History of Present Illness She returns for today to review the results of the CT scan      Objective:    Physical Exam VASCULAR: DP and PT pulse palpable. Foot is warm and well-perfused. Capillary fill time is brisk. DERMATOLOGIC: Normal skin turgor, texture, and temperature. No open lesions, rashes, or ulcerations. NEUROLOGIC: Normal sensation to light touch and pressure. No paresthesias. ORTHOPEDIC: Some tenderness in hallux. Distal hallux in abducted and valgus position. Mild edema of the great toe.  Hammertoe contracture of fourth toe   No images are attached to the encounter.    Results RADIOLOGY Left foot x-ray: Malunited fracture of the distal portion of the proximal phalanx in abduction. No hardware complications or hardware failure.     IMPRESSION: 1. Status post arthrodesis of the first MTP joint with displaced and angulated periprosthetic fracture of the head of the first proximal phalanx. No significant osseous bridging. Fracture margins demonstrate slightly sclerotic margins, suggestive of a chronic etiology. 2. Remote healed fracture deformities of second through fifth metatarsal shafts. Similar sharply marginated deformity of the fifth proximal phalanx shaft. 3. Mild-to-moderate degenerative changes of the forefoot and midfoot.     Electronically Signed   By: Mannie Seek M.D.   On: 09/12/2023 19:38 Assessment:   1. Closed displaced fracture of proximal phalanx of left great toe with malunion, subsequent encounter   2. Hammertoe of left foot   3. Vitamin D  deficiency      Plan:  Patient was evaluated and treated and all questions answered.  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  We discussed that there is a periprosthetic fracture of the proximal phalanx on the distal portion just distal to the plate.  We discussed the nonunion of the fracture site.  I recommended checking her vitamin D  level and order was placed.  We also discussed surgical correction at this point which I think is necessary to correct the deformity alleviate her pain and functional limitation.  She also still has contracture of the fourth toe which has been painful and under lapping of the third toe.  Surgical we discussed removal of the plates and screws from the first MTP fusion ORIF of the fracture fragments, I discussed with her that IPJ fusion will be necessary as well due to the small size of the fracture fragment.  Bone grafting may be necessary as well as bone marrow aspirate concentrate if cadaveric bone is necessary.  Risk benefits and potential complications discussed including not limited to pain, swelling, infection, scar, numbness which may be temporary or permanent, chronic pain, stiffness, nerve pain or damage, wound healing problems, bone healing problems including delayed or non-union.  She understands and wishes to proceed.  Informed consent signed and reviewed.  For the fourth toe we also discussed arthrodesis of the PIPJ with screw fixation       Surgical plan:  Procedure: - Left fourth toe PIPJ arthrodesis, hallux fracture ORIF, IPJ fusion, bone grafting  Location: - [facility]  Anesthesia plan: - Sedation with local block with Exparel  Postoperative pain plan: - Tylenol  1000 mg every 6 hours, ibuprofen  600  mg every 6 hours, gabapentin  300 mg every 8 hours x5 days, oxycodone  5 mg 1-2 tabs every 6 hours only as needed  DVT prophylaxis: - None required  WB Restrictions / DME needs: - Nonweightbearing CAM boot with knee scooter for 2 weeks then transition to walking in boot    No follow-ups on file.

## 2023-09-19 ENCOUNTER — Telehealth: Payer: Self-pay | Admitting: Podiatry

## 2023-09-19 NOTE — Telephone Encounter (Signed)
 PT LEFT MESSAGE YESTERDAY ASKING ABOUT SCHEDULING SURGERY WITH DR [facility].  I LEFT MESSAGE FOR PT TO CALL ME BACK TO GET SCHEDULED.

## 2023-09-20 ENCOUNTER — Encounter: Payer: Self-pay | Admitting: Podiatry

## 2023-09-20 LAB — VITAMIN D 25 HYDROXY (VIT D DEFICIENCY, FRACTURES): Vit D, 25-Hydroxy: 52.7 ng/mL (ref 30.0–100.0)

## 2023-10-01 NOTE — Telephone Encounter (Signed)
 pt lft mess on my vmail. I called back she will email me at [email or web address] her STD forms. DOS is 12/20/23 and she will bring in form for handicapped placard. She wants copy of form emailed back her once faxed.  I did adv her of 10-14 business days for completion of forms

## 2023-10-03 DIAGNOSIS — Z0271 Encounter for disability determination: Secondary | ICD-10-CM

## 2023-10-03 NOTE — Telephone Encounter (Signed)
 See prev notes.  DOS is 12/14/23 not 12/20/23

## 2023-10-03 NOTE — Telephone Encounter (Signed)
 Faxed [employer] forms/notes DOS 12/14/23 and  RTW date 02/19/24. Pt wants copy of forms emailed to her [email or web address]

## 2023-10-19 ENCOUNTER — Telehealth: Payer: Self-pay | Admitting: Podiatry

## 2023-10-19 NOTE — Telephone Encounter (Signed)
 DOS: 12/14/23  (LT) ARTHRODESIS GREAT TOE INTERPHALANGEAL JOINT -09811 (LT) FRACTURE /DISLOCATION PROCEDURE ON TOE /FOOT -91478 (LT) GRAFT IMPLANT PROCEDURE -20900     EFFECTIVE DATE :  05/23/2019    DEDUCTIBLE :  $750.00  REMAINING:   $182.42  OOP:   $4,750.00  REMAINING:   $3,872.42  CO INSURANCE:   20%   PER ANNA P OF [employer] NO PRIOR AUTH IS REQ FOR CPT CODE,28755,28505,20900  [id number]

## 2023-11-12 ENCOUNTER — Telehealth: Payer: Self-pay | Admitting: Podiatry

## 2023-11-12 NOTE — Telephone Encounter (Signed)
 6 mo application ready for pt p/u at [facility] front desk.

## 2023-11-12 NOTE — Telephone Encounter (Signed)
 Pt dropped off handicap application to be filled out. If and how long should the application be filled out for?

## 2023-12-14 ENCOUNTER — Other Ambulatory Visit: Payer: Self-pay | Admitting: Podiatry

## 2023-12-14 DIAGNOSIS — M2042 Other hammer toe(s) (acquired), left foot: Secondary | ICD-10-CM | POA: Diagnosis not present

## 2023-12-14 DIAGNOSIS — S92412A Displaced fracture of proximal phalanx of left great toe, initial encounter for closed fracture: Secondary | ICD-10-CM | POA: Diagnosis not present

## 2023-12-14 DIAGNOSIS — T8484XA Pain due to internal orthopedic prosthetic devices, implants and grafts, initial encounter: Secondary | ICD-10-CM | POA: Diagnosis not present

## 2023-12-14 DIAGNOSIS — M2012 Hallux valgus (acquired), left foot: Secondary | ICD-10-CM | POA: Diagnosis not present

## 2023-12-14 MED ORDER — IBUPROFEN 600 MG PO TABS: 600.0000 mg | ORAL_TABLET | Freq: Four times a day (QID) | ORAL | 0 refills | Status: AC | PRN

## 2023-12-14 MED ORDER — OXYCODONE HCL 5 MG PO TABS: 5.0000 mg | ORAL_TABLET | ORAL | 0 refills | Status: AC | PRN

## 2023-12-14 MED ORDER — ACETAMINOPHEN 500 MG PO TABS: 1000.0000 mg | ORAL_TABLET | Freq: Four times a day (QID) | ORAL | 0 refills | Status: AC | PRN

## 2023-12-14 MED ORDER — GABAPENTIN 300 MG PO CAPS: 300.0000 mg | ORAL_CAPSULE | Freq: Three times a day (TID) | ORAL | 0 refills | Status: AC

## 2023-12-20 ENCOUNTER — Ambulatory Visit (INDEPENDENT_AMBULATORY_CARE_PROVIDER_SITE_OTHER)

## 2023-12-20 ENCOUNTER — Ambulatory Visit (INDEPENDENT_AMBULATORY_CARE_PROVIDER_SITE_OTHER): Admitting: Podiatry

## 2023-12-20 VITALS — Ht 62.0 in | Wt 166.7 lb

## 2023-12-20 DIAGNOSIS — S92412P Displaced fracture of proximal phalanx of left great toe, subsequent encounter for fracture with malunion: Secondary | ICD-10-CM | POA: Diagnosis not present

## 2023-12-20 DIAGNOSIS — M2042 Other hammer toe(s) (acquired), left foot: Secondary | ICD-10-CM

## 2023-12-20 NOTE — Progress Notes (Signed)
  Subjective:  Patient ID: Sheila Galloway, female    DOB: 02/10/1961,  MRN: [id number]  Chief Complaint  Patient presents with   Post-op Follow-up    Rm 10 POV # 1 DOS 12/14/23 LT GREAT TOE FRACTURE REPAIR,FUSION OF GREAT TOE JOINT,CORRECTION OF LT 4TH DIGIT,REMOVAL OF PLATES/SCREWS. Patient is currently wearing short cam boot. There is some bruising and swelling on top of left foot.    63 y.o. female returns for post-op check.  Pain is fairly well-managed  Review of Systems: Negative except as noted in the HPI. Denies N/V/F/Ch.   Objective:  There were no vitals filed for this visit. Body mass index is 30.49 kg/m. Constitutional Well developed. Well nourished.  Vascular Foot warm and well perfused. Capillary refill normal to all digits.  Calf is soft and supple, no posterior calf or knee pain, negative Homans' sign  Neurologic Normal speech. Oriented to person, place, and time. Epicritic sensation to light touch grossly present bilaterally.  Dermatologic Skin healing well without signs of infection. Skin edges well coapted without signs of infection.  Orthopedic: Tenderness to palpation noted about the surgical site.  Moderate edema   Multiple view plain film radiographs: Good correction noted of fourth toe and hallux IPJ fusion with bone graft Assessment:   1. Closed displaced fracture of proximal phalanx of left great toe with malunion, subsequent encounter   2. Hammertoe of left foot    Plan:  Patient was evaluated and treated and all questions answered.  S/p foot surgery left -Progressing as expected post-operatively.  Continue ice and elevation.  Leave dressing intact until next visit.  Foot was redressed with Telfa Curlex and Ace wrap.  Plan for suture removal next visit, she will work from home until then.  Can continue weightbearing as tolerated in the boot.  Plan for pin removal third visit   No follow-ups on file.

## 2024-01-03 ENCOUNTER — Ambulatory Visit (INDEPENDENT_AMBULATORY_CARE_PROVIDER_SITE_OTHER): Admitting: Podiatry

## 2024-01-03 VITALS — Ht 62.0 in | Wt 166.7 lb

## 2024-01-03 DIAGNOSIS — S92412P Displaced fracture of proximal phalanx of left great toe, subsequent encounter for fracture with malunion: Secondary | ICD-10-CM

## 2024-01-03 DIAGNOSIS — M2042 Other hammer toe(s) (acquired), left foot: Secondary | ICD-10-CM

## 2024-01-03 NOTE — Progress Notes (Signed)
  Subjective:  Patient ID: Sheila Galloway, female    DOB: 1960-10-20,  MRN: [id number]  Chief Complaint  Patient presents with   Post-op Follow-up    RM 10 POV # 2 DOS 12/14/23 LT GREAT TOE FRACTURE REPAIR,FUSION OF GREAT TOE JOINT,CORRECTION OF LT 4TH DIGIT,REMOVAL OF PLATES/SCREWS. Patient states minimum pain in left hallux and left 4th toe. Sutures are intact with minor swelling.    63 y.o. female returns for post-op check.   Review of Systems: Negative except as noted in the HPI. Denies N/V/F/Ch.   Objective:  There were no vitals filed for this visit. Body mass index is 30.49 kg/m. Constitutional Well developed. Well nourished.  Vascular Foot warm and well perfused. Capillary refill normal to all digits.  Calf is soft and supple, no posterior calf or knee pain, negative Homans' sign  Neurologic Normal speech. Oriented to person, place, and time. Epicritic sensation to light touch grossly present bilaterally.  Dermatologic Skin healing well without signs of infection. Skin edges well coapted without signs of infection.  Orthopedic: Tenderness to palpation noted about the surgical site.  Moderate edema   Multiple view plain film radiographs: Good correction noted of fourth toe and hallux IPJ fusion with bone graft Assessment:   1. Closed displaced fracture of proximal phalanx of left great toe with malunion, subsequent encounter   2. Hammertoe of left foot    Plan:  Patient was evaluated and treated and all questions answered.  S/p foot surgery left - Sutures removed uneventfully.  Okay to bathe foot.  Return in 3 weeks for new x-rays and pin removal   Return in about 3 weeks (around 01/24/2024) for post op (new x-rays).

## 2024-01-24 ENCOUNTER — Encounter: Payer: Self-pay | Admitting: Podiatry

## 2024-01-24 ENCOUNTER — Ambulatory Visit (INDEPENDENT_AMBULATORY_CARE_PROVIDER_SITE_OTHER): Admitting: Podiatry

## 2024-01-24 ENCOUNTER — Ambulatory Visit (INDEPENDENT_AMBULATORY_CARE_PROVIDER_SITE_OTHER)

## 2024-01-24 VITALS — Ht 62.0 in | Wt 166.7 lb

## 2024-01-24 DIAGNOSIS — S92412P Displaced fracture of proximal phalanx of left great toe, subsequent encounter for fracture with malunion: Secondary | ICD-10-CM

## 2024-01-24 NOTE — Progress Notes (Signed)
  Subjective:  Patient ID: Sheila Galloway, female    DOB: January 04, 1961,  MRN: [id number]  Chief Complaint  Patient presents with   Post-op Follow-up    Rm 7 Patient is here for post-op follow-up # 3 and pin removal. Patient states some tingling in the fourth left toe at night. Patient states no pain or discomfort in the left foot.    63 y.o. female returns for post-op check.   Review of Systems: Negative except as noted in the HPI. Denies N/V/F/Ch.   Objective:  There were no vitals filed for this visit. Body mass index is 30.49 kg/m. Constitutional Well developed. Well nourished.  Vascular Foot warm and well perfused. Capillary refill normal to all digits.  Calf is soft and supple, no posterior calf or knee pain, negative Homans' sign  Neurologic Normal speech. Oriented to person, place, and time. Epicritic sensation to light touch grossly present bilaterally.  Dermatologic Her incisions are well-healed and not hypertrophic  Orthopedic: Edema has improved has no pain to palpation   Multiple view plain film radiographs: No change or complication of hardware there is good consolidation across the fourth toe arthrodesis site and the graft and arthrodesis site at the interphalangeal joint is healing well with 50% integration Assessment:   1. Closed displaced fracture of proximal phalanx of left great toe with malunion, subsequent encounter    Plan:  Patient was evaluated and treated and all questions answered.  S/p foot surgery left - Kirschner wire removed.  Transition to surgical shoe which was dispensed today for 2 to 3 weeks and then back to regular shoe gear such as her [employer].  Return in 6 weeks for final x-rays   Return in about 6 weeks (around 03/06/2024) for post op (new x-rays).

## 2024-03-06 ENCOUNTER — Ambulatory Visit (INDEPENDENT_AMBULATORY_CARE_PROVIDER_SITE_OTHER): Admitting: Podiatry

## 2024-03-06 ENCOUNTER — Encounter: Payer: Self-pay | Admitting: Podiatry

## 2024-03-06 ENCOUNTER — Ambulatory Visit

## 2024-03-06 VITALS — Ht 62.0 in | Wt 166.7 lb

## 2024-03-06 DIAGNOSIS — S92412P Displaced fracture of proximal phalanx of left great toe, subsequent encounter for fracture with malunion: Secondary | ICD-10-CM

## 2024-03-06 NOTE — Progress Notes (Signed)
  Subjective:  Patient ID: Sheila Galloway, female    DOB: 07-Oct-1960,  MRN: [id number]  Chief Complaint  Patient presents with   Post-op Follow-up    RM 19 POV #4 DOS 12/14/23 LT GREAT TOE FRACTURE REPAIR. Pt states some tenders at the tip of the left toe when wearing shoes.    63 y.o. female returns for post-op check.   Review of Systems: Negative except as noted in the HPI. Denies N/V/F/Ch.   Objective:  There were no vitals filed for this visit. Body mass index is 30.49 kg/m. Constitutional Well developed. Well nourished.  Vascular Foot warm and well perfused. Capillary refill normal to all digits.  Calf is soft and supple, no posterior calf or knee pain, negative Homans' sign  Neurologic Normal speech. Oriented to person, place, and time. Epicritic sensation to light touch grossly present bilaterally.  Dermatologic Her incisions are well-healed and not hypertrophic  Orthopedic: Edema has resolved has no pain to palpation   Multiple view plain film radiographs: Incomplete consolidation of graft site no complication of hardware good alignment Assessment:   1. Closed displaced fracture of proximal phalanx of left great toe with malunion, subsequent encounter    Plan:  Patient was evaluated and treated and all questions answered.  S/p foot surgery left - Doing very well.  May continue regular sugar and activity as tolerated.  No physical restrictions at this point.  Return as needed.   No follow-ups on file.

## 2024-05-30 ENCOUNTER — Other Ambulatory Visit: Payer: Self-pay | Admitting: Obstetrics & Gynecology

## 2024-05-30 DIAGNOSIS — Z1231 Encounter for screening mammogram for malignant neoplasm of breast: Secondary | ICD-10-CM

## 2024-06-26 ENCOUNTER — Ambulatory Visit
Admission: RE | Admit: 2024-06-26 | Discharge: 2024-06-26 | Disposition: A | Source: Ambulatory Visit | Attending: Obstetrics & Gynecology | Admitting: Obstetrics & Gynecology

## 2024-06-26 DIAGNOSIS — Z1231 Encounter for screening mammogram for malignant neoplasm of breast: Secondary | ICD-10-CM
